# Patient Record
Sex: Female | Born: 1965
Health system: Southern US, Community
[De-identification: ages and names within clinical notes are randomized; demographics above are authoritative.]

## PROBLEM LIST (undated history)

## (undated) DIAGNOSIS — Z8541 Personal history of malignant neoplasm of cervix uteri: Secondary | ICD-10-CM

## (undated) DIAGNOSIS — T7840XA Allergy, unspecified, initial encounter: Secondary | ICD-10-CM

## (undated) DIAGNOSIS — R32 Unspecified urinary incontinence: Secondary | ICD-10-CM

## (undated) DIAGNOSIS — K589 Irritable bowel syndrome without diarrhea: Secondary | ICD-10-CM

## (undated) DIAGNOSIS — M7711 Lateral epicondylitis, right elbow: Secondary | ICD-10-CM

## (undated) DIAGNOSIS — K5900 Constipation, unspecified: Secondary | ICD-10-CM

## (undated) DIAGNOSIS — E785 Hyperlipidemia, unspecified: Secondary | ICD-10-CM

## (undated) DIAGNOSIS — B019 Varicella without complication: Secondary | ICD-10-CM

## (undated) DIAGNOSIS — I10 Essential (primary) hypertension: Secondary | ICD-10-CM

## (undated) HISTORY — DX: Personal history of malignant neoplasm of cervix uteri: Z85.41

## (undated) HISTORY — DX: Hyperlipidemia, unspecified: E78.5

## (undated) HISTORY — DX: Constipation, unspecified: K59.00

## (undated) HISTORY — DX: Essential (primary) hypertension: I10

## (undated) HISTORY — DX: Allergy, unspecified, initial encounter: T78.40XA

## (undated) HISTORY — DX: Unspecified urinary incontinence: R32

## (undated) HISTORY — DX: Varicella without complication: B01.9

## (undated) HISTORY — DX: Lateral epicondylitis, right elbow: M77.11

## (undated) HISTORY — DX: Irritable bowel syndrome without diarrhea: K58.9

---

## 1993-10-14 HISTORY — PX: RADICAL HYSTERECTOMY: SHX2283

## 2000-10-14 DIAGNOSIS — K589 Irritable bowel syndrome without diarrhea: Secondary | ICD-10-CM

## 2000-10-14 HISTORY — DX: Irritable bowel syndrome, unspecified: K58.9

## 2000-10-14 LAB — HM COLONOSCOPY

## 2006-10-14 HISTORY — PX: BREAST REDUCTION SURGERY: SHX8

## 2012-01-01 ENCOUNTER — Other Ambulatory Visit (HOSPITAL_COMMUNITY): Payer: Self-pay | Admitting: Obstetrics and Gynecology

## 2012-01-01 DIAGNOSIS — Z1231 Encounter for screening mammogram for malignant neoplasm of breast: Secondary | ICD-10-CM

## 2012-01-27 ENCOUNTER — Ambulatory Visit (HOSPITAL_COMMUNITY)
Admission: RE | Admit: 2012-01-27 | Discharge: 2012-01-27 | Disposition: A | Discharge status: Home / Self Care | Payer: BC Managed Care – PPO | Source: Ambulatory Visit | Attending: Obstetrics and Gynecology | Admitting: Obstetrics and Gynecology

## 2012-01-27 DIAGNOSIS — Z1231 Encounter for screening mammogram for malignant neoplasm of breast: Secondary | ICD-10-CM

## 2012-02-10 ENCOUNTER — Other Ambulatory Visit: Payer: Self-pay | Admitting: Obstetrics and Gynecology

## 2012-02-10 DIAGNOSIS — R928 Other abnormal and inconclusive findings on diagnostic imaging of breast: Secondary | ICD-10-CM

## 2012-02-14 ENCOUNTER — Ambulatory Visit
Admission: RE | Admit: 2012-02-14 | Discharge: 2012-02-14 | Disposition: A | Discharge status: Home / Self Care | Payer: BC Managed Care – PPO | Source: Ambulatory Visit | Attending: Obstetrics and Gynecology | Admitting: Obstetrics and Gynecology

## 2012-02-14 DIAGNOSIS — R928 Other abnormal and inconclusive findings on diagnostic imaging of breast: Secondary | ICD-10-CM

## 2012-10-20 ENCOUNTER — Other Ambulatory Visit (HOSPITAL_COMMUNITY): Payer: Self-pay | Admitting: Internal Medicine

## 2012-10-20 DIAGNOSIS — R079 Chest pain, unspecified: Secondary | ICD-10-CM

## 2012-10-28 ENCOUNTER — Ambulatory Visit (HOSPITAL_COMMUNITY)
Admission: RE | Admit: 2012-10-28 | Discharge: 2012-10-28 | Disposition: A | Discharge status: Home / Self Care | Payer: 59 | Source: Ambulatory Visit | Attending: Internal Medicine | Admitting: Internal Medicine

## 2012-10-28 DIAGNOSIS — R079 Chest pain, unspecified: Secondary | ICD-10-CM | POA: Insufficient documentation

## 2013-02-02 ENCOUNTER — Other Ambulatory Visit (HOSPITAL_COMMUNITY): Payer: Self-pay | Admitting: Obstetrics and Gynecology

## 2013-02-02 DIAGNOSIS — Z1231 Encounter for screening mammogram for malignant neoplasm of breast: Secondary | ICD-10-CM

## 2013-02-11 LAB — HM PAP SMEAR

## 2013-02-11 LAB — HM MAMMOGRAPHY

## 2013-02-15 ENCOUNTER — Ambulatory Visit (HOSPITAL_COMMUNITY)
Admission: RE | Admit: 2013-02-15 | Discharge: 2013-02-15 | Disposition: A | Discharge status: Home / Self Care | Payer: 59 | Source: Ambulatory Visit | Attending: Obstetrics and Gynecology | Admitting: Obstetrics and Gynecology

## 2013-02-15 DIAGNOSIS — Z1231 Encounter for screening mammogram for malignant neoplasm of breast: Secondary | ICD-10-CM

## 2013-09-24 ENCOUNTER — Encounter: Payer: Self-pay | Admitting: Physician Assistant

## 2013-09-24 ENCOUNTER — Ambulatory Visit (INDEPENDENT_AMBULATORY_CARE_PROVIDER_SITE_OTHER): Payer: 59 | Admitting: Physician Assistant

## 2013-09-24 VITALS — BP 146/88 | HR 67 | Temp 97.7°F | Ht 64.0 in | Wt 123.8 lb

## 2013-09-24 DIAGNOSIS — G4700 Insomnia, unspecified: Secondary | ICD-10-CM

## 2013-09-24 DIAGNOSIS — I10 Essential (primary) hypertension: Secondary | ICD-10-CM

## 2013-09-24 DIAGNOSIS — E785 Hyperlipidemia, unspecified: Secondary | ICD-10-CM

## 2013-09-24 MED ORDER — LISINOPRIL 10 MG PO TABS: 10.0000 mg | ORAL_TABLET | Freq: Every day | ORAL | Status: DC

## 2013-09-24 MED ORDER — TRAZODONE HCL 50 MG PO TABS: 25.0000 mg | ORAL_TABLET | Freq: Every evening | ORAL | Status: DC | PRN

## 2013-09-24 NOTE — Progress Notes (Signed)
Pre-visit discussion using our clinic review tool. No additional management support is needed unless otherwise documented below in the visit note.  

## 2013-09-24 NOTE — Patient Instructions (Signed)
Please take medication as prescribed.  Monitor BP.  Return to clinic in 2 weeks for a blood pressure recheck.

## 2013-09-26 DIAGNOSIS — E785 Hyperlipidemia, unspecified: Secondary | ICD-10-CM | POA: Insufficient documentation

## 2013-09-26 DIAGNOSIS — G4700 Insomnia, unspecified: Secondary | ICD-10-CM | POA: Insufficient documentation

## 2013-09-26 DIAGNOSIS — I10 Essential (primary) hypertension: Secondary | ICD-10-CM | POA: Insufficient documentation

## 2013-09-26 NOTE — Progress Notes (Signed)
Patient ID: Melody Wilcox, female   DOB: 1966-04-18, 47 y.o.   MRN: [id number]  Patient presents to clinic today for refill of BP medication.  Patient is new to our practice and will be formally establishing with Dr. Abner Greenspan.  Already has visit scheduled.   Patient endorses 1 year history of hypertension diagnosed after her BP was significantly elevated at a dentist's visit.  Patient was evaluated by her previous PCP and diagnosis of HTN made.  Patient placed on 5 mg Lisinopril daily.  Patient denies chest pain, shortness of breath, headache, vision changes, LH or syncope.  Endorses occasional palpitations.  Has had several EKGs, all showing no abnormalities.  Will need to obtain records from prior PCP.  BP elevated at 146/88 today in clinic.  States she does not check her BP at home.  Denies history of anxiety or depression.  Has never been placed on Holter monitor.    Patient also requests refill of her Desyrel that she takes at bedtime for sleep.  Endorses good sleep with medication.  Patient also endorses history of hyperlipidemia.  Patient states her physician instructed her to take a fish oil supplement.  Will need fasting lipid panel at visit with Dr. Abner Greenspan.  Patient declines labs at present time.   Past Medical History  Diagnosis Date  . Hyperlipidemia   . Hypertension     No current outpatient prescriptions on file prior to visit.   No current facility-administered medications on file prior to visit.    Allergies  Allergen Reactions  . Morphine And Related   . Penicillins   . Sulfa Antibiotics   . Tape     Family History  Problem Relation Age of Onset  . Cancer Mother 88    breast cancer  . Hypertension Father   . Heart disease Father   . Hypertension Paternal Uncle     History   Social History  . Marital Status: Married    Spouse Name: N/A    Number of Children: N/A  . Years of Education: N/A   Social History Main Topics  . Smoking status: Never Smoker   .  Smokeless tobacco: Never Used  . Alcohol Use: 4.2 oz/week    7 Glasses of wine per week     Comment: socially -- glass of wine a ngiht.  . Drug Use: No  . Sexual Activity: Yes    Birth Control/ Protection: Surgical   Other Topics Concern  . None   Social History Narrative  . None   Review of Systems - See HPI.  All Other ROS are negative.  Filed Vitals:   09/24/13 1121  BP: 146/88  Pulse: 67  Temp: 97.7 F (36.5 C)   Physical Exam  Vitals reviewed. Constitutional: She is oriented to person, place, and time and well-developed, well-nourished, and in no distress.  HENT:  Head: Normocephalic and atraumatic.  Right Ear: External ear normal.  Left Ear: External ear normal.  Nose: Nose normal.  Mouth/Throat: Oropharynx is clear and moist. No oropharyngeal exudate.  TM within normal limits bilaterally.  Eyes: Conjunctivae and EOM are normal. Pupils are equal, round, and reactive to light.  Neck: Neck supple.  Cardiovascular: Normal rate, regular rhythm, normal heart sounds and intact distal pulses.   Pulmonary/Chest: Effort normal and breath sounds normal. No respiratory distress. She has no wheezes. She has no rales. She exhibits no tenderness.  Neurological: She is alert and oriented to person, place, and time.  Skin: Skin is warm  and dry. No rash noted.  Psychiatric: Affect normal.   Assessment/Plan: No problem-specific assessment & plan notes found for this encounter.

## 2013-09-26 NOTE — Assessment & Plan Note (Signed)
Refill Trazodone.  Patient informed that when she establishes with Dr. Abner Greenspan, we will need to obtain lab work.

## 2013-09-26 NOTE — Assessment & Plan Note (Addendum)
BP elevated.  Reviewed patient's BP log, revealing similar elevated BP readings.  Increase Lisinopril to 10 mg daily.  DASH diet given.  Return in 2 weeks for BP recheck.  Patient endorses occasional palpitations.  Previous EKG from prior PCP reviewed showing NSR.  Will scan into record.  Patient declines holter monitor at present time.

## 2013-10-08 ENCOUNTER — Ambulatory Visit (INDEPENDENT_AMBULATORY_CARE_PROVIDER_SITE_OTHER): Payer: 59

## 2013-10-08 VITALS — BP 108/72 | HR 73

## 2013-10-08 DIAGNOSIS — I10 Essential (primary) hypertension: Secondary | ICD-10-CM

## 2013-10-08 NOTE — Progress Notes (Signed)
   Subjective:    Patient ID: Melody Wilcox, female    DOB: [birth year], 47 y.o.   MRN: [id number]  HPI    Review of Systems     Objective:   Physical Exam        Assessment & Plan:  Patient came in today for a BP check

## 2013-10-20 ENCOUNTER — Ambulatory Visit: Payer: 59 | Admitting: Physician Assistant

## 2013-12-13 ENCOUNTER — Other Ambulatory Visit: Payer: Self-pay | Admitting: Family Medicine

## 2013-12-13 ENCOUNTER — Encounter: Payer: Self-pay | Admitting: Family Medicine

## 2013-12-13 ENCOUNTER — Ambulatory Visit (HOSPITAL_BASED_OUTPATIENT_CLINIC_OR_DEPARTMENT_OTHER)
Admission: RE | Admit: 2013-12-13 | Discharge: 2013-12-13 | Disposition: A | Discharge status: Home / Self Care | Payer: 59 | Source: Ambulatory Visit | Attending: Family Medicine | Admitting: Family Medicine

## 2013-12-13 ENCOUNTER — Ambulatory Visit (INDEPENDENT_AMBULATORY_CARE_PROVIDER_SITE_OTHER): Payer: 59 | Admitting: Family Medicine

## 2013-12-13 VITALS — BP 102/68 | HR 67 | Temp 97.5°F | Ht 64.0 in | Wt 128.1 lb

## 2013-12-13 DIAGNOSIS — E785 Hyperlipidemia, unspecified: Secondary | ICD-10-CM

## 2013-12-13 DIAGNOSIS — N951 Menopausal and female climacteric states: Secondary | ICD-10-CM

## 2013-12-13 DIAGNOSIS — R1013 Epigastric pain: Secondary | ICD-10-CM

## 2013-12-13 DIAGNOSIS — I10 Essential (primary) hypertension: Secondary | ICD-10-CM

## 2013-12-13 DIAGNOSIS — M549 Dorsalgia, unspecified: Secondary | ICD-10-CM

## 2013-12-13 DIAGNOSIS — Z1211 Encounter for screening for malignant neoplasm of colon: Secondary | ICD-10-CM

## 2013-12-13 DIAGNOSIS — Z8541 Personal history of malignant neoplasm of cervix uteri: Secondary | ICD-10-CM

## 2013-12-13 DIAGNOSIS — G4700 Insomnia, unspecified: Secondary | ICD-10-CM

## 2013-12-13 DIAGNOSIS — R32 Unspecified urinary incontinence: Secondary | ICD-10-CM | POA: Insufficient documentation

## 2013-12-13 DIAGNOSIS — K589 Irritable bowel syndrome without diarrhea: Secondary | ICD-10-CM

## 2013-12-13 LAB — SEDIMENTATION RATE: Sed Rate: 1 mm/hr (ref 0–22)

## 2013-12-13 MED ORDER — ESCITALOPRAM OXALATE 10 MG PO TABS: 10.0000 mg | ORAL_TABLET | Freq: Every day | ORAL | Status: DC

## 2013-12-13 MED ORDER — LOSARTAN POTASSIUM 25 MG PO TABS: 25.0000 mg | ORAL_TABLET | Freq: Every day | ORAL | Status: DC

## 2013-12-13 MED ORDER — CYCLOBENZAPRINE HCL 10 MG PO TABS: ORAL_TABLET | ORAL | Status: DC

## 2013-12-13 NOTE — Progress Notes (Signed)
Patient ID: Melody Wilcox, female   DOB: 1966/10/01, 48 y.o.   MRN: [id number] Melody Wilcox [id number] [birth year] 12/13/2013      Progress Note-Follow Up  Subjective  Chief Complaint  Chief Complaint  Patient presents with  . Establish Care    new patient    HPI  Patient is a 48 year old Caucasian female who is in today for new patient appointment. She is generally in good health but does have a few concerns. She has perimenopausal symptoms but night sweats several times a week. They are not overwhelming. She underwent hysterectomy at age 28. She struggles with intermittent constipation and diarrhea as well but no bloody or tarry stool he is struggling with increased epigastric pain recently. Has a dry cough present for roughly 9 months. Noting some intermittent low back pain and right knee pain as well but denies any recent injury. Has an appointment with [facility] later in the spring for her annual exam  Past Medical History  Diagnosis Date  . Hyperlipidemia   . Hypertension   . Chicken pox middle school  . IBS (irritable bowel syndrome) 2002    Past Surgical History  Procedure Laterality Date  . Radical hysterectomy    . Breast reduction surgery  2008  . Abdominal hysterectomy      Family History  Problem Relation Age of Onset  . Cancer Mother 35    breast cancer  . Hypertension Father   . Heart disease Father   . Hypertension Paternal Uncle   . Gout Brother   . Cancer Maternal Grandmother 5    breast  . Diabetes Maternal Grandfather     type 2  . Heart disease Paternal Grandmother   . Other Paternal Grandfather     brain tumor    History   Social History  . Marital Status: Married    Spouse Name: N/A    Number of Children: N/A  . Years of Education: N/A   Occupational History  . Not on file.   Social History Main Topics  . Smoking status: Never Smoker   . Smokeless tobacco: Never Used  . Alcohol Use: 4.2 oz/week    7 Glasses of wine per  week     Comment: socially -- glass of wine a ngiht.  . Drug Use: No  . Sexual Activity: Yes    Birth Control/ Protection: Surgical   Other Topics Concern  . Not on file   Social History Narrative  . No narrative on file    Current Outpatient Prescriptions on File Prior to Visit  Medication Sig Dispense Refill  . lisinopril (PRINIVIL,ZESTRIL) 10 MG tablet Take 1 tablet (10 mg total) by mouth daily.  90 tablet  3  . traZODone (DESYREL) 50 MG tablet Take 0.5 tablets (25 mg total) by mouth at bedtime as needed for sleep.  30 tablet  2   No current facility-administered medications on file prior to visit.    Allergies  Allergen Reactions  . Morphine And Related   . Penicillins   . Sulfa Antibiotics   . Tape     Review of Systems  Review of Systems  Constitutional: Negative for fever, chills and malaise/fatigue.  HENT: Negative for congestion, hearing loss and nosebleeds.   Eyes: Negative for discharge.  Respiratory: Negative for cough, sputum production, shortness of breath and wheezing.   Cardiovascular: Negative for chest pain, palpitations and leg swelling.  Gastrointestinal: Negative for heartburn, nausea, vomiting, abdominal pain, diarrhea, constipation and blood in  stool.  Genitourinary: Negative for dysuria, urgency, frequency and hematuria.  Musculoskeletal: Negative for back pain, falls and myalgias.  Skin: Negative for rash.  Neurological: Negative for dizziness, tremors, sensory change, focal weakness, loss of consciousness, weakness and headaches.  Endo/Heme/Allergies: Negative for polydipsia. Does not bruise/bleed easily.  Psychiatric/Behavioral: Negative for depression and suicidal ideas. The patient is not nervous/anxious and does not have insomnia.     Objective  BP 102/68  Pulse 67  Temp(Src) 97.5 F (36.4 C) (Oral)  Ht 5\' 4"  (1.626 m)  Wt 128 lb 1.3 oz (58.097 kg)  BMI 21.97 kg/m2  SpO2 97%  Physical Exam  Physical Exam  Constitutional: She is  oriented to person, place, and time and well-developed, well-nourished, and in no distress. No distress.  HENT:  Head: Normocephalic and atraumatic.  Right Ear: External ear normal.  Left Ear: External ear normal.  Nose: Nose normal.  Mouth/Throat: Oropharynx is clear and moist. No oropharyngeal exudate.  Eyes: Conjunctivae are normal. Pupils are equal, round, and reactive to light. Right eye exhibits no discharge. Left eye exhibits no discharge. No scleral icterus.  Neck: Normal range of motion. Neck supple. No thyromegaly present.  Cardiovascular: Normal rate, regular rhythm, normal heart sounds and intact distal pulses.   No murmur heard. Pulmonary/Chest: Effort normal and breath sounds normal. No respiratory distress. She has no wheezes. She has no rales.  Abdominal: Soft. Bowel sounds are normal. She exhibits no distension and no mass. There is no tenderness.  Musculoskeletal: Normal range of motion. She exhibits no edema and no tenderness.  Lymphadenopathy:    She has no cervical adenopathy.  Neurological: She is alert and oriented to person, place, and time. She has normal reflexes. No cranial nerve deficit. Coordination normal.  Skin: Skin is warm and dry. No rash noted. She is not diaphoretic.  Psychiatric: Mood, memory and affect normal.      Assessment & Plan  Hyperlipidemia Patient reports mild eleavation in past. Avoid trans fats and monitor, encouraged increase exercise  IBS (irritable bowel syndrome) Encouraged daily probiotics, fiber supplements and increase fiber in diet and fluids.   HTN (hypertension) Well controlled, no changes.  Back pain Encouraged topical treatments such as Salon Pas, gentle stretching, continue Pilates and consider PT and/or chiropractic if persists. Given Flexeril to use qhs prn  Abdominal pain, epigastric H Pylori negative. If persists will need an abdominal ultrasound  Insomnia Using Trazodone prn. Encouraged adequate sleep  hygiene

## 2013-12-13 NOTE — Patient Instructions (Signed)
Salon Pas or Icy Hot patches for back pain  Cholecystitis Cholecystitis is an inflammation of your gallbladder. It is usually caused by a buildup of gallstones or sludge (cholelithiasis) in your gallbladder. The gallbladder stores a fluid that helps digest fats (bile). Cholecystitis is serious and needs treatment right away.  CAUSES   Gallstones. Gallstones can block the tube that leads to your gallbladder, causing bile to build up. As bile builds up, the gallbladder becomes inflamed.  Bile duct problems, such as blockage from scarring or kinking.  Tumors. Tumors can stop bile from leaving your gallbladder correctly, causing bile to build up. As bile builds up, the gallbladder becomes inflamed. SYMPTOMS   Nausea.  Vomiting.  Abdominal pain, especially in the upper right area of your abdomen.  Abdominal tenderness or bloating.  Sweating.  Chills.  Fever.  Yellowing of the skin and the whites of the eyes (jaundice). DIAGNOSIS  Your caregiver may order blood tests to look for infection or gallbladder problems. Your caregiver may also order imaging tests, such as an ultrasound or computed tomography (CT) scan. Further tests may include a hepatobiliary iminodiacetic acid (HIDA) scan. This scan allows your caregiver to see your bile move from the liver to the gallbladder and to the small intestine. TREATMENT  A hospital stay is usually necessary to lessen the inflammation of your gallbladder. You may be required to not eat or drink (fast) for a certain amount of time. You may be given medicine to treat pain or an antibiotic medicine to treat an infection. Surgery may be needed to remove your gallbladder (cholecystectomy) once the inflammation has gone down. Surgery may be needed right away if you develop complications such as death of gallbladder tissue (gangrene) or a tear (perforation) of the gallbladder.  [place] care will depend on your treatment. In general:  If  you were given antibiotics, take them as directed. Finish them even if you start to feel better.  Only take over-the-counter or prescription medicines for pain, discomfort, or fever as directed by your caregiver.  Follow a low-fat diet until you see your caregiver again.  Keep all follow-up visits as directed by your caregiver. SEEK IMMEDIATE MEDICAL CARE IF:   Your pain is increasing and not controlled by medicines.  Your pain moves to another part of your abdomen or to your back.  You have a fever.  You have nausea and vomiting. MAKE SURE YOU:  Understand these instructions.  Will watch your condition.  Will get help right away if you are not doing well or get worse. Document Released: 09/30/2005 Document Revised: 12/23/2011 Document Reviewed: 08/16/2011 [facility] Patient Information 2014 [place], [place].

## 2013-12-13 NOTE — Progress Notes (Signed)
Pre visit review using our clinic review tool, if applicable. No additional management support is needed unless otherwise documented below in the visit note. 

## 2013-12-14 ENCOUNTER — Telehealth: Payer: Self-pay | Admitting: Family Medicine

## 2013-12-14 LAB — HEPATIC FUNCTION PANEL
ALT: 14 U/L (ref 0–35)
AST: 17 U/L (ref 0–37)
Albumin: 4.1 g/dL (ref 3.5–5.2)
Alkaline Phosphatase: 43 U/L (ref 39–117)
BILIRUBIN DIRECT: 0.1 mg/dL (ref 0.0–0.3)
BILIRUBIN TOTAL: 0.3 mg/dL (ref 0.2–1.2)
Indirect Bilirubin: 0.2 mg/dL (ref 0.2–1.2)
Total Protein: 6.1 g/dL (ref 6.0–8.3)

## 2013-12-14 LAB — H. PYLORI ANTIBODY, IGG: H Pylori IgG: <0.4 {ISR}

## 2013-12-14 NOTE — Telephone Encounter (Signed)
Relevant patient education assigned to patient using Emmi. ° °

## 2013-12-15 ENCOUNTER — Encounter: Payer: Self-pay | Admitting: Family Medicine

## 2013-12-15 DIAGNOSIS — N951 Menopausal and female climacteric states: Secondary | ICD-10-CM | POA: Insufficient documentation

## 2013-12-15 DIAGNOSIS — Z8541 Personal history of malignant neoplasm of cervix uteri: Secondary | ICD-10-CM | POA: Insufficient documentation

## 2013-12-15 DIAGNOSIS — M549 Dorsalgia, unspecified: Secondary | ICD-10-CM | POA: Insufficient documentation

## 2013-12-15 DIAGNOSIS — R1013 Epigastric pain: Secondary | ICD-10-CM | POA: Insufficient documentation

## 2013-12-15 DIAGNOSIS — I10 Essential (primary) hypertension: Secondary | ICD-10-CM | POA: Insufficient documentation

## 2013-12-15 HISTORY — DX: Personal history of malignant neoplasm of cervix uteri: Z85.41

## 2013-12-15 NOTE — Assessment & Plan Note (Deleted)
Well controlled, no changes 

## 2013-12-15 NOTE — Assessment & Plan Note (Signed)
Patient reports mild eleavation in past. Avoid trans fats and monitor, encouraged increase exercise

## 2013-12-15 NOTE — Assessment & Plan Note (Signed)
H Pylori negative. If persists will need an abdominal ultrasound

## 2013-12-15 NOTE — Assessment & Plan Note (Signed)
Encouraged daily probiotics, fiber supplements and increase fiber in diet and fluids.

## 2013-12-15 NOTE — Assessment & Plan Note (Signed)
Well controlled, no changes 

## 2013-12-15 NOTE — Assessment & Plan Note (Addendum)
Encouraged topical treatments such as Salon Pas, gentle stretching, continue Pilates and consider PT and/or chiropractic if persists. Given Flexeril to use qhs prn

## 2013-12-15 NOTE — Assessment & Plan Note (Signed)
Using Trazodone prn. Encouraged adequate sleep hygiene

## 2014-01-19 ENCOUNTER — Encounter: Payer: Self-pay | Admitting: Family Medicine

## 2014-02-04 ENCOUNTER — Ambulatory Visit (INDEPENDENT_AMBULATORY_CARE_PROVIDER_SITE_OTHER): Payer: 59 | Admitting: Physician Assistant

## 2014-02-04 ENCOUNTER — Encounter: Payer: Self-pay | Admitting: Physician Assistant

## 2014-02-04 VITALS — BP 120/82 | HR 69 | Temp 97.8°F | Resp 16 | Ht 64.0 in | Wt 129.0 lb

## 2014-02-04 DIAGNOSIS — J209 Acute bronchitis, unspecified: Secondary | ICD-10-CM

## 2014-02-04 MED ORDER — AZITHROMYCIN 250 MG PO TABS: ORAL_TABLET | ORAL | Status: DC

## 2014-02-04 MED ORDER — HYDROCOD POLST-CHLORPHEN POLST 10-8 MG/5ML PO LQCR: 5.0000 mL | Freq: Two times a day (BID) | ORAL | Status: DC | PRN

## 2014-02-04 NOTE — Progress Notes (Signed)
Patient presents to clinic today c/o 4-5 weeks of upper respiratory symptoms that started out as sinus pressure, sinus pain and ear fullness that has moved into her chest.  Endorses 3-4 weeks of cough that is sometimes productive. Denies fever, chills, aches, recent travel or sick contact.  Past Medical History  Diagnosis Date  . Hyperlipidemia   . Hypertension   . Chicken pox middle school  . IBS (irritable bowel syndrome) 2002  . Enuresis     childhood, resolved  . Allergy     PCN, sulfa, Lidocaine cream and morphine  . History of cervical cancer 12/15/2013    Current Outpatient Prescriptions on File Prior to Visit  Medication Sig Dispense Refill  . cyclobenzaprine (FLEXERIL) 10 MG tablet 1 tab po qhs prn pain, muscle spasam  30 tablet  1  . losartan (COZAAR) 25 MG tablet Take 1 tablet (25 mg total) by mouth daily. HTN  30 tablet  5  . traZODone (DESYREL) 50 MG tablet Take 0.5 tablets (25 mg total) by mouth at bedtime as needed for sleep.  30 tablet  2   No current facility-administered medications on file prior to visit.    Allergies  Allergen Reactions  . Morphine And Related   . Penicillins   . Sulfa Antibiotics   . Tape     Family History  Problem Relation Age of Onset  . Cancer Mother 47    breast cancer  . Fibromyalgia Mother   . Hypertension Father   . Heart disease Father   . Hypertension Paternal Uncle   . Gout Brother   . Cancer Maternal Grandmother 3    breast  . Diabetes Maternal Grandfather     type 2  . Heart disease Paternal Grandmother   . Other Paternal Grandfather     brain tumor    History   Social History  . Marital Status: Married    Spouse Name: N/A    Number of Children: N/A  . Years of Education: N/A   Social History Main Topics  . Smoking status: Never Smoker   . Smokeless tobacco: Never Used  . Alcohol Use: 4.2 oz/week    7 Glasses of wine per week     Comment: socially -- glass of wine a ngiht.  . Drug Use: No  . Sexual  Activity: Yes    Birth Control/ Protection: Surgical     Comment: live with   Other Topics Concern  . None   Social History Narrative  . None   Review of Systems - See HPI.  All other ROS are negative.  BP 120/82  Pulse 69  Temp(Src) 97.8 F (36.6 C) (Oral)  Resp 16  Ht 5\' 4"  (1.626 m)  Wt 129 lb (58.514 kg)  BMI 22.13 kg/m2  SpO2 98%  Physical Exam  Vitals reviewed. Constitutional: She is oriented to person, place, and time and well-developed, well-nourished, and in no distress.  HENT:  Head: Normocephalic and atraumatic.  Right Ear: External ear normal.  Left Ear: External ear normal.  Nose: Nose normal.  Mouth/Throat: Oropharynx is clear and moist. No oropharyngeal exudate.  TM within normal limits bilaterally.  No TTP of sinuses noted.  Eyes: Conjunctivae are normal. Pupils are equal, round, and reactive to light.  Neck: Neck supple.  Cardiovascular: Normal rate, regular rhythm, normal heart sounds and intact distal pulses.   No murmur heard. Pulmonary/Chest: Effort normal and breath sounds normal. No respiratory distress. She has no wheezes. She has no  rales. She exhibits no tenderness.  Lymphadenopathy:    She has no cervical adenopathy.  Neurological: She is alert and oriented to person, place, and time.  Skin: Skin is warm and dry. No rash noted.  Psychiatric: Affect normal.   Recent Results (from the past 2160 hour(s))  H. PYLORI ANTIBODY, IGG     Status: None   Collection Time    12/13/13  4:21 PM      Result Value Ref Range   H Pylori IgG <0.40     Comment: No significant level of IgG antibody to H. pylori detected.              ISR = Immune Status Ratio                  <0.90         ISR       Negative                  0.90 - 1.09   ISR       Equivocal                  >=1.10        ISR       Positive           The above results were obtained with the Immulite 2000 H. pylori IgG     EIA.  Results obtained from other manufacturer's assay methods may  not     be used interchangeably.        SEDIMENTATION RATE     Status: None   Collection Time    12/13/13  4:21 PM      Result Value Ref Range   Sed Rate 1  0 - 22 mm/hr  HEPATIC FUNCTION PANEL     Status: None   Collection Time    12/13/13  4:21 PM      Result Value Ref Range   Total Bilirubin 0.3  0.2 - 1.2 mg/dL   Bilirubin, Direct 0.1  0.0 - 0.3 mg/dL   Indirect Bilirubin 0.2  0.2 - 1.2 mg/dL   Alkaline Phosphatase 43  39 - 117 U/L   AST 17  0 - 37 U/L   ALT 14  0 - 35 U/L   Total Protein 6.1  6.0 - 8.3 g/dL   Albumin 4.1  3.5 - 5.2 g/dL   Assessment/Plan: Acute bronchitis Rx Azithromycin.  Increase fluid intake.  Rx Tussionex for cough.  Continue Flonase.  Plain Mucinex.  Humidifier in bedroom.

## 2014-02-04 NOTE — Assessment & Plan Note (Signed)
Rx Azithromycin.  Increase fluid intake.  Rx Tussionex for cough.  Continue Flonase.  Plain Mucinex.  Humidifier in bedroom.

## 2014-02-04 NOTE — Progress Notes (Signed)
Pre visit review using our clinic review tool, if applicable. No additional management support is needed unless otherwise documented below in the visit note/SLS  

## 2014-02-04 NOTE — Patient Instructions (Signed)
Please take antibiotic as directed.  Increase fluid intake.  Rest. Use Flonase daily.  Use prescription cough syrup as directed.  Place a humidifier in the bedroom.  Call or return to clinic if symptoms are not improving.

## 2014-02-10 ENCOUNTER — Ambulatory Visit (INDEPENDENT_AMBULATORY_CARE_PROVIDER_SITE_OTHER): Payer: 59 | Admitting: Family Medicine

## 2014-02-10 ENCOUNTER — Encounter: Payer: Self-pay | Admitting: Family Medicine

## 2014-02-10 VITALS — BP 116/84 | HR 93 | Temp 98.3°F | Ht 64.0 in | Wt 129.0 lb

## 2014-02-10 DIAGNOSIS — I10 Essential (primary) hypertension: Secondary | ICD-10-CM

## 2014-02-10 DIAGNOSIS — J209 Acute bronchitis, unspecified: Secondary | ICD-10-CM

## 2014-02-10 MED ORDER — ALBUTEROL SULFATE HFA 108 (90 BASE) MCG/ACT IN AERS: 2.0000 | INHALATION_SPRAY | Freq: Four times a day (QID) | RESPIRATORY_TRACT | Status: DC | PRN

## 2014-02-10 MED ORDER — PREDNISONE 20 MG PO TABS: 20.0000 mg | ORAL_TABLET | Freq: Two times a day (BID) | ORAL | Status: DC

## 2014-02-10 MED ORDER — HYDROCODONE-HOMATROPINE 5-1.5 MG/5ML PO SYRP: 5.0000 mL | ORAL_SOLUTION | Freq: Three times a day (TID) | ORAL | Status: DC | PRN

## 2014-02-10 MED ORDER — DOXYCYCLINE HYCLATE 100 MG PO TABS: 100.0000 mg | ORAL_TABLET | Freq: Two times a day (BID) | ORAL | Status: DC

## 2014-02-10 NOTE — Patient Instructions (Addendum)
Probiotic daily Digestive Advanatage  Needs number for gastroenterology  Bronchitis Bronchitis is inflammation of the airways that extend from the windpipe into the lungs (bronchi). The inflammation often causes mucus to develop, which leads to a cough. If the inflammation becomes severe, it may cause shortness of breath. CAUSES  Bronchitis may be caused by:   Viral infections.   Bacteria.   Cigarette smoke.   Allergens, pollutants, and other irritants.  SIGNS AND SYMPTOMS  The most common symptom of bronchitis is a frequent cough that produces mucus. Other symptoms include:  Fever.   Body aches.   Chest congestion.   Chills.   Shortness of breath.   Sore throat.  DIAGNOSIS  Bronchitis is usually diagnosed through a medical history and physical exam. Tests, such as chest X-rays, are sometimes done to rule out other conditions.  TREATMENT  You may need to avoid contact with whatever caused the problem (smoking, for example). Medicines are sometimes needed. These may include:  Antibiotics. These may be prescribed if the condition is caused by bacteria.  Cough suppressants. These may be prescribed for relief of cough symptoms.   Inhaled medicines. These may be prescribed to help open your airways and make it easier for you to breathe.   Steroid medicines. These may be prescribed for those with recurrent (chronic) bronchitis. HOME CARE INSTRUCTIONS  Get plenty of rest.   Drink enough fluids to keep your urine clear or pale yellow (unless you have a medical condition that requires fluid restriction). Increasing fluids may help thin your secretions and will prevent dehydration.   Only take over-the-counter or prescription medicines as directed by your health care provider.  Only take antibiotics as directed. Make sure you finish them even if you start to feel better.  Avoid secondhand smoke, irritating chemicals, and strong fumes. These will make bronchitis  worse. If you are a smoker, quit smoking. Consider using nicotine gum or skin patches to help control withdrawal symptoms. Quitting smoking will help your lungs heal faster.   Put a cool-mist humidifier in your bedroom at night to moisten the air. This may help loosen mucus. Change the water in the humidifier daily. You can also run the hot water in your shower and sit in the bathroom with the door closed for 5 10 minutes.   Follow up with your health care provider as directed.   Wash your hands frequently to avoid catching bronchitis again or spreading an infection to others.  SEEK MEDICAL CARE IF: Your symptoms do not improve after 1 week of treatment.  SEEK IMMEDIATE MEDICAL CARE IF:  Your fever increases.  You have chills.   You have chest pain.   You have worsening shortness of breath.   You have bloody sputum.  You faint.  You have lightheadedness.  You have a severe headache.   You vomit repeatedly. MAKE SURE YOU:   Understand these instructions.  Will watch your condition.  Will get help right away if you are not doing well or get worse. Document Released: 09/30/2005 Document Revised: 07/21/2013 Document Reviewed: 05/25/2013 [facility] Patient Information 2014 [place].

## 2014-02-10 NOTE — Progress Notes (Signed)
Pre visit review using our clinic review tool, if applicable. No additional management support is needed unless otherwise documented below in the visit note. 

## 2014-02-13 ENCOUNTER — Encounter: Payer: Self-pay | Admitting: Family Medicine

## 2014-02-13 NOTE — Progress Notes (Signed)
Patient ID: Melody Wilcox, female   DOB: Mar 19, 1966, 48 y.o.   MRN: [id number] Melody Wilcox [id number] 01-Mar-1966 02/13/2014      Progress Note-Follow Up  Subjective  Chief Complaint  Chief Complaint  Patient presents with  . Bronchitis    not any better, finished zpak yesterday    HPI  Patient is a 48 year old female in today for routine medical care.  Patient was recently treated for bronchitis is just fnished her Z-Pak and continues to cough andi 2 sputum production, fatigue and malaise. No fevers chills but some congestion. No ear pain or sore throat. Denies CP/palp/SOB/HA/congestion/fevers/GI or GU c/o. Taking meds as prescribed  Past Medical History  Diagnosis Date  . Hyperlipidemia   . Hypertension   . Chicken pox middle school  . IBS (irritable bowel syndrome) 2002  . Enuresis     childhood, resolved  . Allergy     PCN, sulfa, Lidocaine cream and morphine  . History of cervical cancer 12/15/2013    Past Surgical History  Procedure Laterality Date  . Radical hysterectomy    . Breast reduction surgery  2008  . Abdominal hysterectomy      Family History  Problem Relation Age of Onset  . Cancer Mother 48    breast cancer  . Fibromyalgia Mother   . Hypertension Father   . Heart disease Father   . Hypertension Paternal Uncle   . Gout Brother   . Cancer Maternal Grandmother 28    breast  . Diabetes Maternal Grandfather     type 2  . Heart disease Paternal Grandmother   . Other Paternal Grandfather     brain tumor    History   Social History  . Marital Status: Married    Spouse Name: N/A    Number of Children: N/A  . Years of Education: N/A   Occupational History  . Not on file.   Social History Main Topics  . Smoking status: Never Smoker   . Smokeless tobacco: Never Used  . Alcohol Use: 4.2 oz/week    7 Glasses of wine per week     Comment: socially -- glass of wine a ngiht.  . Drug Use: No  . Sexual Activity: Yes    Birth Control/ Protection:  Surgical     Comment: live with   Other Topics Concern  . Not on file   Social History Narrative  . No narrative on file    Current Outpatient Prescriptions on File Prior to Visit  Medication Sig Dispense Refill  . fluticasone (FLONASE) 50 MCG/ACT nasal spray Place 2 sprays into both nostrils daily as needed for allergies or rhinitis.      Marland Kitchen losartan (COZAAR) 25 MG tablet Take 1 tablet (25 mg total) by mouth daily. HTN  30 tablet  5  . traZODone (DESYREL) 50 MG tablet Take 0.5 tablets (25 mg total) by mouth at bedtime as needed for sleep.  30 tablet  2   No current facility-administered medications on file prior to visit.    Allergies  Allergen Reactions  . Morphine And Related   . Penicillins   . Sulfa Antibiotics   . Tape     Review of Systems  Review of Systems  Constitutional: Positive for malaise/fatigue. Negative for fever.  HENT: Positive for congestion.   Eyes: Negative for discharge.  Respiratory: Positive for cough and sputum production. Negative for shortness of breath.   Cardiovascular: Negative for chest pain, palpitations and leg swelling.  Gastrointestinal: Negative for nausea, abdominal pain and diarrhea.  Genitourinary: Negative for dysuria.  Musculoskeletal: Positive for myalgias. Negative for falls.  Skin: Negative for rash.  Neurological: Negative for loss of consciousness and headaches.  Endo/Heme/Allergies: Negative for polydipsia.  Psychiatric/Behavioral: Negative for depression and suicidal ideas. The patient is not nervous/anxious and does not have insomnia.     Objective  BP 116/84  Pulse 93  Temp(Src) 98.3 F (36.8 C) (Oral)  Ht 5\' 4"  (1.626 m)  Wt 129 lb (58.514 kg)  BMI 22.13 kg/m2  SpO2 98%  Physical Exam  Physical Exam  Constitutional: She is oriented to person, place, and time and well-developed, well-nourished, and in no distress. No distress.  HENT:  Head: Normocephalic and atraumatic.  Eyes: Conjunctivae are normal.   Neck: Neck supple. No thyromegaly present.  Cardiovascular: Normal rate, regular rhythm and normal heart sounds.   No murmur heard. Pulmonary/Chest: Effort normal. No respiratory distress. She has no wheezes. She has no rales.  Abdominal: She exhibits no distension and no mass.  Musculoskeletal: She exhibits no edema.  Lymphadenopathy:    She has no cervical adenopathy.  Neurological: She is alert and oriented to person, place, and time.  Skin: Skin is warm and dry. No rash noted. She is not diaphoretic.  Psychiatric: Memory, affect and judgment normal.      Lab Results  Component Value Date   ALT 14 12/13/2013   AST 17 12/13/2013   ALKPHOS 43 12/13/2013   BILITOT 0.3 12/13/2013     Assessment & Plan  HTN (hypertension) Well controlled, no changes to meds. Encouraged heart healthy diet such as the DASH diet and exercise as tolerated.   Acute bronchitis Started pm steroids and antibiotics, Encouraged increased rest and hydration, add probiotics, zinc such as Coldeze or Xicam. Treat fevers as needed

## 2014-02-13 NOTE — Assessment & Plan Note (Signed)
Well controlled, no changes to meds. Encouraged heart healthy diet such as the DASH diet and exercise as tolerated.  °

## 2014-02-13 NOTE — Assessment & Plan Note (Signed)
Started pm steroids and antibiotics, Encouraged increased rest and hydration, add probiotics, zinc such as Coldeze or Xicam. Treat fevers as needed

## 2014-03-03 ENCOUNTER — Other Ambulatory Visit: Payer: Self-pay | Admitting: Obstetrics and Gynecology

## 2014-03-03 DIAGNOSIS — N644 Mastodynia: Secondary | ICD-10-CM

## 2014-03-04 ENCOUNTER — Other Ambulatory Visit: Payer: Self-pay | Admitting: Obstetrics and Gynecology

## 2014-03-04 DIAGNOSIS — Z803 Family history of malignant neoplasm of breast: Secondary | ICD-10-CM

## 2014-03-15 ENCOUNTER — Other Ambulatory Visit: Payer: 59

## 2014-03-15 ENCOUNTER — Ambulatory Visit: Payer: 59 | Admitting: Family Medicine

## 2014-03-16 ENCOUNTER — Other Ambulatory Visit: Payer: Self-pay | Admitting: Obstetrics and Gynecology

## 2014-03-16 ENCOUNTER — Ambulatory Visit
Admission: RE | Admit: 2014-03-16 | Discharge: 2014-03-16 | Disposition: A | Discharge status: Home / Self Care | Payer: 59 | Source: Ambulatory Visit | Attending: Obstetrics and Gynecology | Admitting: Obstetrics and Gynecology

## 2014-03-16 DIAGNOSIS — N644 Mastodynia: Secondary | ICD-10-CM

## 2014-04-18 ENCOUNTER — Other Ambulatory Visit: Payer: Self-pay | Admitting: Physician Assistant

## 2014-04-18 NOTE — Telephone Encounter (Signed)
rx printed for md to sign and fax 

## 2014-05-08 ENCOUNTER — Other Ambulatory Visit: Payer: Self-pay | Admitting: Family Medicine

## 2014-05-09 NOTE — Telephone Encounter (Signed)
Please advise refill? Last RX and appt on 02-10-14 quantity 30 with 1 refill

## 2014-05-13 ENCOUNTER — Ambulatory Visit (INDEPENDENT_AMBULATORY_CARE_PROVIDER_SITE_OTHER): Payer: 59 | Admitting: Physician Assistant

## 2014-05-13 ENCOUNTER — Encounter: Payer: Self-pay | Admitting: Physician Assistant

## 2014-05-13 VITALS — BP 128/82 | HR 75 | Temp 97.9°F | Resp 16 | Ht 64.0 in | Wt 132.2 lb

## 2014-05-13 DIAGNOSIS — R05 Cough: Secondary | ICD-10-CM | POA: Insufficient documentation

## 2014-05-13 DIAGNOSIS — F909 Attention-deficit hyperactivity disorder, unspecified type: Secondary | ICD-10-CM | POA: Insufficient documentation

## 2014-05-13 DIAGNOSIS — R059 Cough, unspecified: Secondary | ICD-10-CM | POA: Insufficient documentation

## 2014-05-13 MED ORDER — LISDEXAMFETAMINE DIMESYLATE 40 MG PO CAPS
40.0000 mg | ORAL_CAPSULE | ORAL | Status: DC
Start: 2014-05-13 — End: 2014-08-26

## 2014-05-13 MED ORDER — OMEPRAZOLE 40 MG PO CPDR: 40.0000 mg | DELAYED_RELEASE_CAPSULE | Freq: Every day | ORAL | Status: DC

## 2014-05-13 NOTE — Assessment & Plan Note (Signed)
Will restart Vyvanse 40 mg daily.  ADRs discussed with patient.  Follow-up with PCP in 1 month.

## 2014-05-13 NOTE — Assessment & Plan Note (Signed)
Possible silent reflux.  Will attempt 2 week trial of 40 mg Prilosec. If symptoms not improving, will need imaging and specialty referral.

## 2014-05-13 NOTE — Progress Notes (Signed)
Pre visit review using our clinic review tool, if applicable. No additional management support is needed unless otherwise documented below in the visit note/SLS  

## 2014-05-13 NOTE — Progress Notes (Signed)
Patient presents to clinic today c/o persistent dry cough over the past several months.  Symptoms initially thought to be due to ACEI therapy. ACEI was discontinued and symptoms have remained.  Patient denies SOB or wheezing.  Denies PND.  Denies acid reflux but endorses globus sensation.  Patient is a never smoker.  Patient also wishes to discuss restarting medications for her adult ADD.  Patient endorses previously being treated with Vyvanse with good resolution of symptoms.  Endorses inattentiveness and hyperactivity.  Has trouble finishing tasks.  Frequently interrupts others.  Has difficulty remaining still.  Past Medical History  Diagnosis Date  . Hyperlipidemia   . Hypertension   . Chicken pox middle school  . IBS (irritable bowel syndrome) 2002  . Enuresis     childhood, resolved  . Allergy     PCN, sulfa, Lidocaine cream and morphine  . History of cervical cancer 12/15/2013    Current Outpatient Prescriptions on File Prior to Visit  Medication Sig Dispense Refill  . cyclobenzaprine (FLEXERIL) 10 MG tablet TAKE 1 TABLET BY MOUTH EVERY NIGHT AT BEDTIME AS NEEDED FOR PAIN AND FOR MUSCLE SPASM  30 tablet  0  . fluticasone (FLONASE) 50 MCG/ACT nasal spray Place 2 sprays into both nostrils daily as needed for allergies or rhinitis.      Marland Kitchen losartan (COZAAR) 25 MG tablet Take 1 tablet (25 mg total) by mouth daily. HTN  30 tablet  5  . traZODone (DESYREL) 50 MG tablet TAKE 1/2 TABLETS (25 MG TOTAL) BY MOUTH AT BEDTIME AS NEEDED FOR SLEEP.  30 tablet  1   No current facility-administered medications on file prior to visit.    Allergies  Allergen Reactions  . Morphine And Related   . Penicillins   . Sulfa Antibiotics   . Tape     Family History  Problem Relation Age of Onset  . Cancer Mother 68    breast cancer  . Fibromyalgia Mother   . Hypertension Father   . Heart disease Father   . Hypertension Paternal Uncle   . Gout Brother   . Cancer Maternal Grandmother 58    breast   . Diabetes Maternal Grandfather     type 2  . Heart disease Paternal Grandmother   . Other Paternal Grandfather     brain tumor    History   Social History  . Marital Status: Married    Spouse Name: N/A    Number of Children: N/A  . Years of Education: N/A   Social History Main Topics  . Smoking status: Never Smoker   . Smokeless tobacco: Never Used  . Alcohol Use: 4.2 oz/week    7 Glasses of wine per week     Comment: socially -- glass of wine a ngiht.  . Drug Use: No  . Sexual Activity: Yes    Birth Control/ Protection: Surgical     Comment: live with   Other Topics Concern  . Not on file   Social History Narrative  . No narrative on file   Review of Systems - See HPI.  All other ROS are negative.  BP 128/82  Pulse 75  Temp(Src) 97.9 F (36.6 C) (Oral)  Resp 16  Ht 5\' 4"  (1.626 m)  Wt 132 lb 4 oz (59.988 kg)  BMI 22.69 kg/m2  SpO2 98%  Physical Exam  Vitals reviewed. Constitutional: She is well-developed, well-nourished, and in no distress.  HENT:  Head: Normocephalic and atraumatic.  Right Ear: External ear normal.  Left Ear: External ear normal.  Nose: Nose normal.  Mouth/Throat: Oropharynx is clear and moist. No oropharyngeal exudate.  TM within normal limits bilaterally.  Eyes: Conjunctivae are normal. Pupils are equal, round, and reactive to light.  Neck: Neck supple.  Cardiovascular: Normal rate, regular rhythm, normal heart sounds and intact distal pulses.   Pulmonary/Chest: Effort normal and breath sounds normal. No respiratory distress. She has no wheezes. She has no rales. She exhibits no tenderness.  Abdominal: Soft. Bowel sounds are normal. There is no tenderness.  Lymphadenopathy:    She has no cervical adenopathy.  Skin: Skin is warm and dry. No rash noted.  Psychiatric: Affect normal.    No results found for this or any previous visit (from the past 2160 hour(s)).  Assessment/Plan: Adult ADHD Will restart Vyvanse 40 mg daily.   ADRs discussed with patient.  Follow-up with PCP in 1 month.  Cough Possible silent reflux.  Will attempt 2 week trial of 40 mg Prilosec. If symptoms not improving, will need imaging and specialty referral.

## 2014-05-13 NOTE — Patient Instructions (Signed)
Increase your fluid intake.  Take Prilosec daily for 2 weeks to see if cough is stemming from silent reflux.  If symptoms are not improving, then we need to proceed with imaging and a specialty referral.  For ADD -- begin taking Vyvanse daily.  Follow-up with Dr. Charlett Blake in 1 month.  If you develop palpitations of difficulty sleeping, please stop medication and call the office.

## 2014-05-17 ENCOUNTER — Telehealth: Payer: Self-pay

## 2014-05-17 NOTE — Telephone Encounter (Signed)
PA for Vyvanse has been approved through 05-17-15.  Faxed a copy to pharmacy also

## 2014-07-27 NOTE — Telephone Encounter (Signed)
Patient states that pharmacy never received this fax.

## 2014-07-28 NOTE — Telephone Encounter (Signed)
Please inform patient that the pharmacy just needs to have the pharmacy run it again

## 2014-08-10 ENCOUNTER — Telehealth: Payer: Self-pay | Admitting: Family Medicine

## 2014-08-10 NOTE — Telephone Encounter (Signed)
Caller name: [place] Relation to pt: Call back number:[phone]   Reason for call: pt wants refill on Rx lisdexamfetamine (VYVANSE) 40 MG capsule.

## 2014-08-10 NOTE — Telephone Encounter (Signed)
Patient does not belong to me.  Was restarted at visit for acute concerns with myself.  She was due for a follow-up 1 month after starting.  No refill until follow-up with her PCP, Dr. Charlett Blake.

## 2014-08-10 NOTE — Telephone Encounter (Signed)
Last ov - 05/08/14 Last refilled- 05/13/14 #30 / 0 rf  UDS- none

## 2014-08-11 NOTE — Telephone Encounter (Signed)
See below

## 2014-08-11 NOTE — Telephone Encounter (Signed)
Please inform pt of Melody Wilcox's message

## 2014-08-26 ENCOUNTER — Ambulatory Visit (INDEPENDENT_AMBULATORY_CARE_PROVIDER_SITE_OTHER): Payer: BC Managed Care – PPO | Admitting: Family Medicine

## 2014-08-26 ENCOUNTER — Encounter: Payer: Self-pay | Admitting: Family Medicine

## 2014-08-26 VITALS — BP 114/76 | HR 66 | Temp 98.0°F | Ht 64.0 in | Wt 133.6 lb

## 2014-08-26 DIAGNOSIS — I10 Essential (primary) hypertension: Secondary | ICD-10-CM

## 2014-08-26 DIAGNOSIS — F909 Attention-deficit hyperactivity disorder, unspecified type: Secondary | ICD-10-CM

## 2014-08-26 DIAGNOSIS — K589 Irritable bowel syndrome without diarrhea: Secondary | ICD-10-CM

## 2014-08-26 DIAGNOSIS — M7711 Lateral epicondylitis, right elbow: Secondary | ICD-10-CM

## 2014-08-26 DIAGNOSIS — F900 Attention-deficit hyperactivity disorder, predominantly inattentive type: Secondary | ICD-10-CM

## 2014-08-26 MED ORDER — LISDEXAMFETAMINE DIMESYLATE 40 MG PO CAPS: 40.0000 mg | ORAL_CAPSULE | ORAL | Status: DC

## 2014-08-26 MED ORDER — LOSARTAN POTASSIUM 25 MG PO TABS: 25.0000 mg | ORAL_TABLET | Freq: Every day | ORAL | Status: DC

## 2014-08-26 NOTE — Progress Notes (Signed)
Pre visit review using our clinic review tool, if applicable. No additional management support is needed unless otherwise documented below in the visit note. 

## 2014-08-26 NOTE — Patient Instructions (Addendum)
Elderberry/Echinacea/Zinc liquid [email or web address] by [place] patches or gel   Lateral Epicondylitis (Tennis Elbow) with Rehab Lateral epicondylitis involves inflammation and pain around the outer portion of the elbow. The pain is caused by inflammation of the tendons in the forearm that bring back (extend) the wrist. Lateral epicondylitis is also called tennis elbow, because it is very common in tennis players. However, it may occur in any individual who extends the wrist repetitively. If lateral epicondylitis is left untreated, it may become a chronic problem. SYMPTOMS   Pain, tenderness, and inflammation on the outer (lateral) side of the elbow.  Pain or weakness with gripping activities.  Pain that increases with wrist-twisting motions (playing tennis, using a screwdriver, opening a door or a jar).  Pain with lifting objects, including a coffee cup. CAUSES  Lateral epicondylitis is caused by inflammation of the tendons that extend the wrist. Causes of injury may include:  Repetitive stress and strain on the muscles and tendons that extend the wrist.  Sudden change in activity level or intensity.  Incorrect grip in racquet sports.  Incorrect grip size of racquet (often too large).  Incorrect hitting position or technique (usually backhand, leading with the elbow).  Using a racket that is too heavy. RISK INCREASES WITH:  Sports or occupations that require repetitive and/or strenuous forearm and wrist movements (tennis, squash, racquetball, carpentry).  Poor wrist and forearm strength and flexibility.  Failure to warm up properly before activity.  Resuming activity before healing, rehabilitation, and conditioning are complete. PREVENTION   Warm up and stretch properly before activity.  Maintain physical fitness:  Strength, flexibility, and endurance.  Cardiovascular fitness.  Wear and use properly fitted equipment.  Learn and use proper technique  and have a coach correct improper technique.  Wear a tennis elbow (counterforce) brace. PROGNOSIS  The course of this condition depends on the degree of the injury. If treated properly, acute cases (symptoms lasting less than 4 weeks) are often resolved in 2 to 6 weeks. Chronic (longer lasting cases) often resolve in 3 to 6 months but may require physical therapy. RELATED COMPLICATIONS   Frequently recurring symptoms, resulting in a chronic problem. Properly treating the problem the first time decreases frequency of recurrence.  Chronic inflammation, scarring tendon degeneration, and partial tendon tear, requiring surgery.  Delayed healing or resolution of symptoms. TREATMENT  Treatment first involves the use of ice and medicine to reduce pain and inflammation. Strengthening and stretching exercises may help reduce discomfort if performed regularly. These exercises may be performed at home if the condition is an acute injury. Chronic cases may require a referral to a physical therapist for evaluation and treatment. Your caregiver may advise a corticosteroid injection to help reduce inflammation. Rarely, surgery is needed. MEDICATION  If pain medicine is needed, nonsteroidal anti-inflammatory medicines (aspirin and ibuprofen), or other minor pain relievers (acetaminophen), are often advised.  Do not take pain medicine for 7 days before surgery.  Prescription pain relievers may be given, if your caregiver thinks they are needed. Use only as directed and only as much as you need.  Corticosteroid injections may be recommended. These injections should be reserved only for the most severe cases, because they can only be given a certain number of times. HEAT AND COLD  Cold treatment (icing) should be applied for 10 to 15 minutes every 2 to 3 hours for inflammation and pain, and immediately after activity that aggravates your symptoms. Use ice packs or an ice massage.  Heat  treatment may be used  before performing stretching and strengthening activities prescribed by your caregiver, physical therapist, or athletic trainer. Use a heat pack or a warm water soak. SEEK MEDICAL CARE IF: Symptoms get worse or do not improve in 2 weeks, despite treatment. EXERCISES  RANGE OF MOTION (ROM) AND STRETCHING EXERCISES - Epicondylitis, Lateral (Tennis Elbow) These exercises may help you when beginning to rehabilitate your injury. Your symptoms may go away with or without further involvement from your physician, physical therapist, or athletic trainer. While completing these exercises, remember:   Restoring tissue flexibility helps normal motion to return to the joints. This allows healthier, less painful movement and activity.  An effective stretch should be held for at least 30 seconds.  A stretch should never be painful. You should only feel a gentle lengthening or release in the stretched tissue. RANGE OF MOTION - Wrist Flexion, Active-Assisted  Extend your right / left elbow with your fingers pointing down.*  Gently pull the back of your hand towards you, until you feel a gentle stretch on the top of your forearm.  Hold this position for __________ seconds. Repeat __________ times. Complete this exercise __________ times per day.  *If directed by your physician, physical therapist or athletic trainer, complete this stretch with your elbow bent, rather than extended. RANGE OF MOTION - Wrist Extension, Active-Assisted  Extend your right / left elbow and turn your palm upwards.*  Gently pull your palm and fingertips back, so your wrist extends and your fingers point more toward the ground.  You should feel a gentle stretch on the inside of your forearm.  Hold this position for __________ seconds. Repeat __________ times. Complete this exercise __________ times per day. *If directed by your physician, physical therapist or athletic trainer, complete this stretch with your elbow bent, rather  than extended. STRETCH - Wrist Flexion  Place the back of your right / left hand on a tabletop, leaving your elbow slightly bent. Your fingers should point away from your body.  Gently press the back of your hand down onto the table by straightening your elbow. You should feel a stretch on the top of your forearm.  Hold this position for __________ seconds. Repeat __________ times. Complete this stretch __________ times per day.  STRETCH - Wrist Extension   Place your right / left fingertips on a tabletop, leaving your elbow slightly bent. Your fingers should point backwards.  Gently press your fingers and palm down onto the table by straightening your elbow. You should feel a stretch on the inside of your forearm.  Hold this position for __________ seconds. Repeat __________ times. Complete this stretch __________ times per day.  STRENGTHENING EXERCISES - Epicondylitis, Lateral (Tennis Elbow) These exercises may help you when beginning to rehabilitate your injury. They may resolve your symptoms with or without further involvement from your physician, physical therapist, or athletic trainer. While completing these exercises, remember:   Muscles can gain both the endurance and the strength needed for everyday activities through controlled exercises.  Complete these exercises as instructed by your physician, physical therapist or athletic trainer. Increase the resistance and repetitions only as guided.  You may experience muscle soreness or fatigue, but the pain or discomfort you are trying to eliminate should never worsen during these exercises. If this pain does get worse, stop and make sure you are following the directions exactly. If the pain is still present after adjustments, discontinue the exercise until you can discuss the trouble with your caregiver.  STRENGTH - Wrist Flexors  Sit with your right / left forearm palm-up and fully supported on a table or countertop. Your elbow should  be resting below the height of your shoulder. Allow your wrist to extend over the edge of the surface.  Loosely holding a __________ weight, or a piece of rubber exercise band or tubing, slowly curl your hand up toward your forearm.  Hold this position for __________ seconds. Slowly lower the wrist back to the starting position in a controlled manner. Repeat __________ times. Complete this exercise __________ times per day.  STRENGTH - Wrist Extensors  Sit with your right / left forearm palm-down and fully supported on a table or countertop. Your elbow should be resting below the height of your shoulder. Allow your wrist to extend over the edge of the surface.  Loosely holding a __________ weight, or a piece of rubber exercise band or tubing, slowly curl your hand up toward your forearm.  Hold this position for __________ seconds. Slowly lower the wrist back to the starting position in a controlled manner. Repeat __________ times. Complete this exercise __________ times per day.  STRENGTH - Ulnar Deviators  Stand with a ____________________ weight in your right / left hand, or sit while holding a rubber exercise band or tubing, with your healthy arm supported on a table or countertop.  Move your wrist, so that your pinkie travels toward your forearm and your thumb moves away from your forearm.  Hold this position for __________ seconds and then slowly lower the wrist back to the starting position. Repeat __________ times. Complete this exercise __________ times per day STRENGTH - Radial Deviators  Stand with a ____________________ weight in your right / left hand, or sit while holding a rubber exercise band or tubing, with your injured arm supported on a table or countertop.  Raise your hand upward in front of you or pull up on the rubber tubing.  Hold this position for __________ seconds and then slowly lower the wrist back to the starting position. Repeat __________ times. Complete  this exercise __________ times per day. STRENGTH - Forearm Supinators   Sit with your right / left forearm supported on a table, keeping your elbow below shoulder height. Rest your hand over the edge, palm down.  Gently grip a hammer or a soup ladle.  Without moving your elbow, slowly turn your palm and hand upward to a "thumbs-up" position.  Hold this position for __________ seconds. Slowly return to the starting position. Repeat __________ times. Complete this exercise __________ times per day.  STRENGTH - Forearm Pronators   Sit with your right / left forearm supported on a table, keeping your elbow below shoulder height. Rest your hand over the edge, palm up.  Gently grip a hammer or a soup ladle.  Without moving your elbow, slowly turn your palm and hand upward to a "thumbs-up" position.  Hold this position for __________ seconds. Slowly return to the starting position. Repeat __________ times. Complete this exercise __________ times per day.  STRENGTH - Grip  Grasp a tennis ball, a dense sponge, or a large, rolled sock in your hand.  Squeeze as hard as you can, without increasing any pain.  Hold this position for __________ seconds. Release your grip slowly. Repeat __________ times. Complete this exercise __________ times per day.  STRENGTH - Elbow Extensors, Isometric  Stand or sit upright, on a firm surface. Place your right / left arm so that your palm faces your stomach, and  it is at the height of your waist.  Place your opposite hand on the underside of your forearm. Gently push up as your right / left arm resists. Push as hard as you can with both arms, without causing any pain or movement at your right / left elbow. Hold this stationary position for __________ seconds. Gradually release the tension in both arms. Allow your muscles to relax completely before repeating. Document Released: 09/30/2005 Document Revised: 02/14/2014 Document Reviewed: 01/12/2009 [facility]  Patient Information 2015 [place], [place]. This information is not intended to replace advice given to you by your health care provider. Make sure you discuss any questions you have with your health care provider.

## 2014-08-29 ENCOUNTER — Telehealth: Payer: Self-pay | Admitting: Family Medicine

## 2014-08-29 NOTE — Telephone Encounter (Signed)
Caller name: [place], [place] Relation to pt: self  Call back number: [phone]   Reason for call:  Pt states the MG has been changed regarding losartan (COZAAR) 25 MG tablet and wanted to know if this was accurate.

## 2014-08-29 NOTE — Telephone Encounter (Signed)
I do not see a change of dose on the Losartan in the computer, please confirm and notify patient. THX

## 2014-08-29 NOTE — Telephone Encounter (Signed)
Please advise? It looks like MD sent this in during visit on 08-26-14

## 2014-08-30 NOTE — Telephone Encounter (Signed)
I spoke to patient and she was very demanding, frustrated and screaming when I tried to get information from pt. I put the call on speaker so Dr Charlett Blake could hear also and pt was stating that she got Losartan 100 mg last time from Dr Charlett Blake and now Losartan 25 mg was called in to the pharmacy.  I tried to explain to the patient that we would have to contact the pharmacy to see why she got the 100 mg or she could call them because we have never prescribed this before. The patient went on to ask me if I thought she was stupid because she has the medication in front of her.   I tried to explain to patient that we didn't think she was stupid and then dr blyth took the phone.  Dr Charlett Blake tried to explain to pt and after about 5 minutes pt started to calm down a little.  Dr Charlett Blake informed the patient that the 25 mg was the appropriate dose and to continue taking this due to the fact of her not taking any medication the day of her appt and the BP being ok.  I called the pharmacy and spoke to Mickel Baas (the pharmacist) and she states that this was there mistake and they would contact the patient due to it being there mistake of giving the patient 100 mg of Losartan last month instead of the correct dose of 25 mg.

## 2014-08-30 NOTE — Telephone Encounter (Signed)
Pt returned your call please reach pt on home # [phone]

## 2014-08-30 NOTE — Telephone Encounter (Signed)
Left a message for patient to return my call. 

## 2014-09-04 ENCOUNTER — Encounter: Payer: Self-pay | Admitting: Family Medicine

## 2014-09-04 DIAGNOSIS — M7711 Lateral epicondylitis, right elbow: Secondary | ICD-10-CM

## 2014-09-04 HISTORY — DX: Lateral epicondylitis, right elbow: M77.11

## 2014-09-04 NOTE — Progress Notes (Signed)
Melody Wilcox  [id number] 28-Nov-1965 09/04/2014      Progress Note-Follow Up  Subjective  Chief Complaint  Chief Complaint  Patient presents with  . Follow-up    medication check    HPI  Patient is a 48 y.o. female in today for routine medical care. Generally feeling well although she has been struggling with right elbow pain recently. Denies any injury or redness or warmth. No recent illness. Denies CP/palp/SOB/HA/congestion/fevers/GI or GU c/o. Taking meds as prescribed  Past Medical History  Diagnosis Date  . Hyperlipidemia   . Hypertension   . Chicken pox middle school  . IBS (irritable bowel syndrome) 2002  . Enuresis     childhood, resolved  . Allergy     PCN, sulfa, Lidocaine cream and morphine  . History of cervical cancer 12/15/2013  . Lateral epicondylitis of right elbow 09/04/2014    Past Surgical History  Procedure Laterality Date  . Radical hysterectomy    . Breast reduction surgery  2008  . Abdominal hysterectomy      Family History  Problem Relation Age of Onset  . Cancer Mother 57    breast cancer  . Fibromyalgia Mother   . Hypertension Father   . Heart disease Father   . Hypertension Paternal Uncle   . Gout Brother   . Cancer Maternal Grandmother 107    breast  . Diabetes Maternal Grandfather     type 2  . Heart disease Paternal Grandmother   . Other Paternal Grandfather     brain tumor  . Gout Sister     History   Social History  . Marital Status: Married    Spouse Name: N/A    Number of Children: N/A  . Years of Education: N/A   Occupational History  . Not on file.   Social History Main Topics  . Smoking status: Never Smoker   . Smokeless tobacco: Never Used  . Alcohol Use: 4.2 oz/week    7 Glasses of wine per week     Comment: socially -- glass of wine a ngiht.  . Drug Use: No  . Sexual Activity: Yes    Birth Control/ Protection: Surgical     Comment: live with   Other Topics Concern  . Not on file   Social History  Narrative    Current Outpatient Prescriptions on File Prior to Visit  Medication Sig Dispense Refill  . fluticasone (FLONASE) 50 MCG/ACT nasal spray Place 2 sprays into both nostrils daily as needed for allergies or rhinitis.     No current facility-administered medications on file prior to visit.    Allergies  Allergen Reactions  . Morphine And Related   . Penicillins   . Sulfa Antibiotics   . Tape     Review of Systems  Review of Systems  Constitutional: Negative for fever and malaise/fatigue.  HENT: Negative for congestion.   Eyes: Negative for discharge.  Respiratory: Negative for shortness of breath.   Cardiovascular: Negative for chest pain, palpitations and leg swelling.  Gastrointestinal: Negative for nausea, abdominal pain and diarrhea.  Genitourinary: Negative for dysuria.  Musculoskeletal: Positive for joint pain. Negative for falls.       Right elbow pain, off and on for a couple weeks, denies an injury  Skin: Negative for rash.  Neurological: Negative for loss of consciousness and headaches.  Endo/Heme/Allergies: Negative for polydipsia.  Psychiatric/Behavioral: Negative for depression and suicidal ideas. The patient is not nervous/anxious and does not have insomnia.  Objective  BP 114/76 mmHg  Pulse 66  Temp(Src) 98 F (36.7 C) (Oral)  Ht 5\' 4"  (1.626 m)  Wt 133 lb 9.6 oz (60.601 kg)  BMI 22.92 kg/m2  SpO2 100%  Physical Exam  Physical Exam  Constitutional: She is oriented to person, place, and time and well-developed, well-nourished, and in no distress. No distress.  HENT:  Head: Normocephalic and atraumatic.  Eyes: Conjunctivae are normal.  Neck: Neck supple. No thyromegaly present.  Cardiovascular: Normal rate, regular rhythm and normal heart sounds.   No murmur heard. Pulmonary/Chest: Effort normal and breath sounds normal. She has no wheezes.  Abdominal: She exhibits no distension and no mass.  Musculoskeletal: She exhibits no edema.    Lymphadenopathy:    She has no cervical adenopathy.  Neurological: She is alert and oriented to person, place, and time.  Skin: Skin is warm and dry. No rash noted. She is not diaphoretic.  Psychiatric: Memory, affect and judgment normal.    No results found for: TSH No results found for: WBC, HGB, HCT, MCV, PLT No results found for: CREATININE, BUN, NA, K, CL, CO2 Lab Results  Component Value Date   ALT 14 12/13/2013   AST 17 12/13/2013   ALKPHOS 43 12/13/2013   BILITOT 0.3 12/13/2013   No results found for: CHOL No results found for: HDL No results found for: LDLCALC No results found for: TRIG No results found for: CHOLHDL   Assessment & Plan  HTN (hypertension) Well controlled, no changes to meds. Encouraged heart healthy diet such as the DASH diet and exercise as tolerated.   Adult ADHD Doing well on current meds may continue, allowed refills  IBS (irritable bowel syndrome) Avoid offending foods, start probiotics. Do not eat large meals in late evening and consider raising head of bed.   Lateral epicondylitis of right elbow Ice and Salon pas bid report if no improvement for referral

## 2014-09-04 NOTE — Assessment & Plan Note (Signed)
Avoid offending foods, start probiotics. Do not eat large meals in late evening and consider raising head of bed.  

## 2014-09-04 NOTE — Assessment & Plan Note (Signed)
Doing well on current meds may continue, allowed refills

## 2014-09-04 NOTE — Assessment & Plan Note (Signed)
Well controlled, no changes to meds. Encouraged heart healthy diet such as the DASH diet and exercise as tolerated.  °

## 2014-09-04 NOTE — Assessment & Plan Note (Signed)
Ice and Salon pas bid report if no improvement for referral

## 2014-11-21 ENCOUNTER — Ambulatory Visit (INDEPENDENT_AMBULATORY_CARE_PROVIDER_SITE_OTHER): Payer: BLUE CROSS/BLUE SHIELD | Admitting: Physician Assistant

## 2014-11-21 ENCOUNTER — Encounter: Payer: Self-pay | Admitting: Physician Assistant

## 2014-11-21 VITALS — BP 150/92 | HR 79 | Temp 98.2°F | Resp 16 | Ht 64.0 in | Wt 130.4 lb

## 2014-11-21 DIAGNOSIS — B9689 Other specified bacterial agents as the cause of diseases classified elsewhere: Secondary | ICD-10-CM

## 2014-11-21 DIAGNOSIS — J019 Acute sinusitis, unspecified: Secondary | ICD-10-CM

## 2014-11-21 DIAGNOSIS — B029 Zoster without complications: Secondary | ICD-10-CM

## 2014-11-21 MED ORDER — VALACYCLOVIR HCL 1 G PO TABS: 1000.0000 mg | ORAL_TABLET | Freq: Three times a day (TID) | ORAL | Status: DC

## 2014-11-21 MED ORDER — VALACYCLOVIR HCL 1 G PO TABS: 1000.0000 mg | ORAL_TABLET | Freq: Two times a day (BID) | ORAL | Status: DC

## 2014-11-21 MED ORDER — HYDROCOD POLST-CHLORPHEN POLST 10-8 MG/5ML PO LQCR: 5.0000 mL | Freq: Two times a day (BID) | ORAL | Status: DC | PRN

## 2014-11-21 MED ORDER — LEVOFLOXACIN 750 MG PO TABS: 750.0000 mg | ORAL_TABLET | Freq: Every day | ORAL | Status: DC

## 2014-11-21 NOTE — Assessment & Plan Note (Signed)
Rx Valtrex.  Prognosis of rash discussed.  She has been directed to keep rash covered as is contagious.  Follow-up PRN.

## 2014-11-21 NOTE — Patient Instructions (Signed)
Please take antibiotic as directed.  Increase fluid intake.  Use Saline nasal spray.  Take a daily multivitamin. Use Tussionex for cough.  Place a humidifier in the bedroom. Take the Valtrex three times daily as directed.  Keep the rash covered.  Please call or return clinic if symptoms are not improving.  Sinusitis Sinusitis is redness, soreness, and swelling (inflammation) of the paranasal sinuses. Paranasal sinuses are air pockets within the bones of your face (beneath the eyes, the middle of the forehead, or above the eyes). In healthy paranasal sinuses, mucus is able to drain out, and air is able to circulate through them by way of your nose. However, when your paranasal sinuses are inflamed, mucus and air can become trapped. This can allow bacteria and other germs to grow and cause infection. Sinusitis can develop quickly and last only a short time (acute) or continue over a long period (chronic). Sinusitis that lasts for more than 12 weeks is considered chronic.  CAUSES  Causes of sinusitis include:  Allergies.  Structural abnormalities, such as displacement of the cartilage that separates your nostrils (deviated septum), which can decrease the air flow through your nose and sinuses and affect sinus drainage.  Functional abnormalities, such as when the small hairs (cilia) that line your sinuses and help remove mucus do not work properly or are not present. SYMPTOMS  Symptoms of acute and chronic sinusitis are the same. The primary symptoms are pain and pressure around the affected sinuses. Other symptoms include:  Upper toothache.  Earache.  Headache.  Bad breath.  Decreased sense of smell and taste.  A cough, which worsens when you are lying flat.  Fatigue.  Fever.  Thick drainage from your nose, which often is green and may contain pus (purulent).  Swelling and warmth over the affected sinuses. DIAGNOSIS  Your caregiver will perform a physical exam. During the exam, your  caregiver may:  Look in your nose for signs of abnormal growths in your nostrils (nasal polyps).  Tap over the affected sinus to check for signs of infection.  View the inside of your sinuses (endoscopy) with a special imaging device with a light attached (endoscope), which is inserted into your sinuses. If your caregiver suspects that you have chronic sinusitis, one or more of the following tests may be recommended:  Allergy tests.  Nasal culture A sample of mucus is taken from your nose and sent to a lab and screened for bacteria.  Nasal cytology A sample of mucus is taken from your nose and examined by your caregiver to determine if your sinusitis is related to an allergy. TREATMENT  Most cases of acute sinusitis are related to a viral infection and will resolve on their own within 10 days. Sometimes medicines are prescribed to help relieve symptoms (pain medicine, decongestants, nasal steroid sprays, or saline sprays).  However, for sinusitis related to a bacterial infection, your caregiver will prescribe antibiotic medicines. These are medicines that will help kill the bacteria causing the infection.  Rarely, sinusitis is caused by a fungal infection. In theses cases, your caregiver will prescribe antifungal medicine. For some cases of chronic sinusitis, surgery is needed. Generally, these are cases in which sinusitis recurs more than 3 times per year, despite other treatments. HOME CARE INSTRUCTIONS   Drink plenty of water. Water helps thin the mucus so your sinuses can drain more easily.  Use a humidifier.  Inhale steam 3 to 4 times a day (for example, sit in the bathroom with the  shower running).  Apply a warm, moist washcloth to your face 3 to 4 times a day, or as directed by your caregiver.  Use saline nasal sprays to help moisten and clean your sinuses.  Take over-the-counter or prescription medicines for pain, discomfort, or fever only as directed by your caregiver. SEEK  IMMEDIATE MEDICAL CARE IF:  You have increasing pain or severe headaches.  You have nausea, vomiting, or drowsiness.  You have swelling around your face.  You have vision problems.  You have a stiff neck.  You have difficulty breathing. MAKE SURE YOU:   Understand these instructions.  Will watch your condition.  Will get help right away if you are not doing well or get worse. Document Released: 09/30/2005 Document Revised: 12/23/2011 Document Reviewed: 10/15/2011 [facility] Patient Information 2014 [place], [place].

## 2014-11-21 NOTE — Progress Notes (Signed)
Patient presents to clinic today c/o sinus pressure, sinus pain, ear pain and facial pain x 5 days. Endorses sore throat and PND.  Denies fever, chest pain or SOB.  Endorses mild cough.  Has taken Nyquil with some relief of symptoms.  Patient endorses pruritic and tingling rash of left anterior chest since this morning.  Endorses history of chicken pox but denies hx of shingles rash.  Denies change to lotions, detergents of hygiene product.  Past Medical History  Diagnosis Date  . Hyperlipidemia   . Hypertension   . Chicken pox middle school  . IBS (irritable bowel syndrome) 2002  . Enuresis     childhood, resolved  . Allergy     PCN, sulfa, Lidocaine cream and morphine  . History of cervical cancer 12/15/2013  . Lateral epicondylitis of right elbow 09/04/2014    Current Outpatient Prescriptions on File Prior to Visit  Medication Sig Dispense Refill  . fluticasone (FLONASE) 50 MCG/ACT nasal spray Place 2 sprays into both nostrils daily as needed for allergies or rhinitis.    Marland Kitchen lisdexamfetamine (VYVANSE) 40 MG capsule Take 1 capsule (40 mg total) by mouth every morning. January 2016 rx (Patient taking differently: Take 40 mg by mouth daily as needed. January 2016 rx) 30 capsule 0  . losartan (COZAAR) 25 MG tablet Take 1 tablet (25 mg total) by mouth daily. HTN 30 tablet 5   No current facility-administered medications on file prior to visit.    Allergies  Allergen Reactions  . Morphine And Related   . Other Swelling    Numbing Creams  . Penicillins   . Sulfa Antibiotics   . Tape     Family History  Problem Relation Age of Onset  . Cancer Mother 54    breast cancer  . Fibromyalgia Mother   . Hypertension Father   . Heart disease Father   . Hypertension Paternal Uncle   . Gout Brother   . Cancer Maternal Grandmother 9    breast  . Diabetes Maternal Grandfather     type 2  . Heart disease Paternal Grandmother   . Other Paternal Grandfather     brain tumor  . Gout  Sister     History   Social History  . Marital Status: Married    Spouse Name: N/A    Number of Children: N/A  . Years of Education: N/A   Social History Main Topics  . Smoking status: Never Smoker   . Smokeless tobacco: Never Used  . Alcohol Use: 4.2 oz/week    7 Glasses of wine per week     Comment: socially -- glass of wine a ngiht.  . Drug Use: No  . Sexual Activity: Yes    Birth Control/ Protection: Surgical     Comment: live with   Other Topics Concern  . None   Social History Narrative   Review of Systems - See HPI.  All other ROS are negative.  BP 150/92 mmHg  Pulse 79  Temp(Src) 98.2 F (36.8 C) (Oral)  Resp 16  Ht 5\' 4"  (1.626 m)  Wt 130 lb 6 oz (59.138 kg)  BMI 22.37 kg/m2  SpO2 100%  Physical Exam  Constitutional: She is oriented to person, place, and time and well-developed, well-nourished, and in no distress.  HENT:  Head: Normocephalic and atraumatic.  Right Ear: Tympanic membrane and ear canal normal.  Left Ear: Tympanic membrane and ear canal normal.  Nose: Mucosal edema present. Right sinus exhibits frontal  sinus tenderness. Left sinus exhibits frontal sinus tenderness.  Mouth/Throat: Uvula is midline, oropharynx is clear and moist and mucous membranes are normal.  Eyes: Conjunctivae are normal.  Neck: Neck supple.  Cardiovascular: Normal rate, regular rhythm, normal heart sounds and intact distal pulses.   Pulmonary/Chest: Effort normal and breath sounds normal. No respiratory distress. She has no wheezes. She has no rales. She exhibits no tenderness.  Neurological: She is alert and oriented to person, place, and time.  Skin: Skin is warm and dry.     Psychiatric: Affect normal.  Vitals reviewed.  Assessment/Plan: Acute bacterial sinusitis Rx Levaquin.  Increase fluids.  Rest.  Saline nasal spray.  Probiotic.  Mucinex as directed.  Humidifier in bedroom.  Call or return to clinic if symptoms are not improving.    Shingles rash Rx  Valtrex.  Prognosis of rash discussed.  She has been directed to keep rash covered as is contagious.  Follow-up PRN.

## 2014-11-21 NOTE — Assessment & Plan Note (Signed)
Rx Levaquin.  Increase fluids.  Rest.  Saline nasal spray.  Probiotic.  Mucinex as directed.  Humidifier in bedroom.  Call or return to clinic if symptoms are not improving.

## 2014-11-21 NOTE — Progress Notes (Signed)
Pre visit review using our clinic review tool, if applicable. No additional management support is needed unless otherwise documented below in the visit note/SLS  

## 2015-02-01 ENCOUNTER — Other Ambulatory Visit: Payer: Self-pay | Admitting: Family Medicine

## 2015-02-02 NOTE — Telephone Encounter (Signed)
I have approved #[id number] with 1 rf but give her the heads up that I will need to see her roughly twice a year to keep this going so would benefit from another appt before too long

## 2015-02-02 NOTE — Telephone Encounter (Signed)
Patient called back stating that she is starting a new job and will have to call back to schedule appointment

## 2015-02-02 NOTE — Telephone Encounter (Signed)
Last Office Visit 08/26/14 Last Refill 08/26/14  #30 with  1 Refill. Advise on Trazodone refill

## 2015-02-02 NOTE — Telephone Encounter (Signed)
Called the patient left a detailed message of PCP instructions on refill.

## 2015-03-09 ENCOUNTER — Ambulatory Visit (INDEPENDENT_AMBULATORY_CARE_PROVIDER_SITE_OTHER): Payer: BLUE CROSS/BLUE SHIELD | Admitting: Family Medicine

## 2015-03-09 ENCOUNTER — Encounter: Payer: Self-pay | Admitting: Family Medicine

## 2015-03-09 VITALS — BP 122/70 | HR 65 | Temp 98.3°F | Ht 63.0 in | Wt 128.1 lb

## 2015-03-09 DIAGNOSIS — K5900 Constipation, unspecified: Secondary | ICD-10-CM

## 2015-03-09 DIAGNOSIS — E785 Hyperlipidemia, unspecified: Secondary | ICD-10-CM | POA: Diagnosis not present

## 2015-03-09 DIAGNOSIS — F900 Attention-deficit hyperactivity disorder, predominantly inattentive type: Secondary | ICD-10-CM

## 2015-03-09 DIAGNOSIS — F909 Attention-deficit hyperactivity disorder, unspecified type: Secondary | ICD-10-CM

## 2015-03-09 DIAGNOSIS — M25511 Pain in right shoulder: Secondary | ICD-10-CM

## 2015-03-09 DIAGNOSIS — G4700 Insomnia, unspecified: Secondary | ICD-10-CM

## 2015-03-09 DIAGNOSIS — I10 Essential (primary) hypertension: Secondary | ICD-10-CM | POA: Diagnosis not present

## 2015-03-09 MED ORDER — LISDEXAMFETAMINE DIMESYLATE 40 MG PO CAPS: 40.0000 mg | ORAL_CAPSULE | ORAL | Status: DC

## 2015-03-09 MED ORDER — TRAZODONE HCL 50 MG PO TABS: ORAL_TABLET | ORAL | Status: DC

## 2015-03-09 MED ORDER — LOSARTAN POTASSIUM 25 MG PO TABS: 25.0000 mg | ORAL_TABLET | Freq: Every day | ORAL | Status: DC

## 2015-03-09 NOTE — Patient Instructions (Addendum)
Call to have lab work ordered the week before your annual exam, lipid, cmp, cbc, tsh, hyperlipidemia mixed, HTN, ess   Hypertension Hypertension, commonly called high blood pressure, is when the force of blood pumping through your arteries is too strong. Your arteries are the blood vessels that carry blood from your heart throughout your body. A blood pressure reading consists of a higher number over a lower number, such as 110/72. The higher number (systolic) is the pressure inside your arteries when your heart pumps. The lower number (diastolic) is the pressure inside your arteries when your heart relaxes. Ideally you want your blood pressure below 120/80. Hypertension forces your heart to work harder to pump blood. Your arteries may become narrow or stiff. Having hypertension puts you at risk for heart disease, stroke, and other problems.  RISK FACTORS Some risk factors for high blood pressure are controllable. Others are not.  Risk factors you cannot control include:   Race. You may be at higher risk if you are African American.  Age. Risk increases with age.  Gender. Men are at higher risk than women before age 12 years. After age 69, women are at higher risk than men. Risk factors you can control include:  Not getting enough exercise or physical activity.  Being overweight.  Getting too much fat, sugar, calories, or salt in your diet.  Drinking too much alcohol. SIGNS AND SYMPTOMS Hypertension does not usually cause signs or symptoms. Extremely high blood pressure (hypertensive crisis) may cause headache, anxiety, shortness of breath, and nosebleed. DIAGNOSIS  To check if you have hypertension, your health care provider will measure your blood pressure while you are seated, with your arm held at the level of your heart. It should be measured at least twice using the same arm. Certain conditions can cause a difference in blood pressure between your right and left arms. A blood  pressure reading that is higher than normal on one occasion does not mean that you need treatment. If one blood pressure reading is high, ask your health care provider about having it checked again. TREATMENT  Treating high blood pressure includes making lifestyle changes and possibly taking medicine. Living a healthy lifestyle can help lower high blood pressure. You may need to change some of your habits. Lifestyle changes may include:  Following the DASH diet. This diet is high in fruits, vegetables, and whole grains. It is low in salt, red meat, and added sugars.  Getting at least 2 hours of brisk physical activity every week.  Losing weight if necessary.  Not smoking.  Limiting alcoholic beverages.  Learning ways to reduce stress. If lifestyle changes are not enough to get your blood pressure under control, your health care provider may prescribe medicine. You may need to take more than one. Work closely with your health care provider to understand the risks and benefits. HOME CARE INSTRUCTIONS  Have your blood pressure rechecked as directed by your health care provider.   Take medicines only as directed by your health care provider. Follow the directions carefully. Blood pressure medicines must be taken as prescribed. The medicine does not work as well when you skip doses. Skipping doses also puts you at risk for problems.   Do not smoke.   Monitor your blood pressure at home as directed by your health care provider. SEEK MEDICAL CARE IF:   You think you are having a reaction to medicines taken.  You have recurrent headaches or feel dizzy.  You have swelling in  your ankles.  You have trouble with your vision. SEEK IMMEDIATE MEDICAL CARE IF:  You develop a severe headache or confusion.  You have unusual weakness, numbness, or feel faint.  You have severe chest or abdominal pain.  You vomit repeatedly.  You have trouble breathing. MAKE SURE YOU:   Understand  these instructions.  Will watch your condition.  Will get help right away if you are not doing well or get worse. Document Released: 09/30/2005 Document Revised: 02/14/2014 Document Reviewed: 07/23/2013 [facility] Patient Information 2015 [place], [place]. This information is not intended to replace advice given to you by your health care provider. Make sure you discuss any questions you have with your health care provider.

## 2015-03-09 NOTE — Progress Notes (Signed)
Pre visit review using our clinic review tool, if applicable. No additional management support is needed unless otherwise documented below in the visit note. 

## 2015-03-10 ENCOUNTER — Encounter: Payer: Self-pay | Admitting: Family Medicine

## 2015-03-16 ENCOUNTER — Other Ambulatory Visit: Payer: Self-pay

## 2015-03-16 DIAGNOSIS — Z1231 Encounter for screening mammogram for malignant neoplasm of breast: Secondary | ICD-10-CM

## 2015-03-19 ENCOUNTER — Encounter: Payer: Self-pay | Admitting: Family Medicine

## 2015-03-19 DIAGNOSIS — K5900 Constipation, unspecified: Secondary | ICD-10-CM

## 2015-03-19 DIAGNOSIS — M25519 Pain in unspecified shoulder: Secondary | ICD-10-CM | POA: Insufficient documentation

## 2015-03-19 HISTORY — DX: Constipation, unspecified: K59.00

## 2015-03-19 NOTE — Assessment & Plan Note (Signed)
Restarted Vyvanse today at patient request

## 2015-03-19 NOTE — Assessment & Plan Note (Signed)
Encouraged moist heat and gentle stretching as tolerated. May try NSAIDs and prescription meds as directed and report if symptoms worsen or seek immediate care. Referred to sports medicine for further consideration.  

## 2015-03-19 NOTE — Assessment & Plan Note (Signed)
Encouraged good sleep hygiene such as dark, quiet room. No blue/green glowing lights such as computer screens in bedroom. No alcohol or stimulants in evening. Cut down on caffeine as able. Regular exercise is helpful but not just prior to bed time. Trazodone helpful, refill given

## 2015-03-19 NOTE — Progress Notes (Signed)
Melody Wilcox  [id number] 12-Feb-1966 03/19/2015      Progress Note-Follow Up  Subjective  Chief Complaint  Chief Complaint  Patient presents with  . Neck Pain  . Shoulder Pain    HPI  Patient is a 49 y.o. female in today for routine medical care. Patient is in today for evaluation of numerous concerns. Is noting she would like to restart Vyvanse which worked well and she had no side effects with. Needs a refill on her trazodone which also works well. Is complaining of right-sided neck and shoulder pain for the last several months. Denies any obvious injury. No radicular symptoms. Pain is primarily on the right side of the neck and the shoulder. She is also complaining of some fatigue but no recent fevers or illness. Has some mild constipation moving her bowels with straining every couple of days. Has had some mild epigastric discomfort at times. Denies CP/palp/SOB/HA/congestion/fevers or GU c/o. Taking meds as prescribed to get  Past Medical History  Diagnosis Date  . Hyperlipidemia   . Hypertension   . Chicken pox middle school  . IBS (irritable bowel syndrome) 2002  . Enuresis     childhood, resolved  . Allergy     PCN, sulfa, Lidocaine cream and morphine  . History of cervical cancer 12/15/2013  . Lateral epicondylitis of right elbow 09/04/2014    Past Surgical History  Procedure Laterality Date  . Radical hysterectomy    . Breast reduction surgery  2008  . Abdominal hysterectomy      Family History  Problem Relation Age of Onset  . Cancer Mother 63    breast cancer  . Fibromyalgia Mother   . Hypertension Father   . Heart disease Father   . Hypertension Paternal Uncle   . Gout Brother   . Cancer Maternal Grandmother 23    breast  . Diabetes Maternal Grandfather     type 2  . Heart disease Paternal Grandmother   . Other Paternal Grandfather     brain tumor  . Gout Sister     History   Social History  . Marital Status: Married    Spouse Name: N/A  .  Number of Children: N/A  . Years of Education: N/A   Occupational History  . Not on file.   Social History Main Topics  . Smoking status: Never Smoker   . Smokeless tobacco: Never Used  . Alcohol Use: 4.2 oz/week    7 Glasses of wine per week     Comment: socially -- glass of wine a ngiht.  . Drug Use: No  . Sexual Activity: Yes    Birth Control/ Protection: Surgical     Comment: live with   Other Topics Concern  . Not on file   Social History Narrative    Current Outpatient Prescriptions on File Prior to Visit  Medication Sig Dispense Refill  . fluticasone (FLONASE) 50 MCG/ACT nasal spray Place 2 sprays into both nostrils daily as needed for allergies or rhinitis.     No current facility-administered medications on file prior to visit.    Allergies  Allergen Reactions  . Morphine And Related   . Other Swelling    Numbing Creams  . Penicillins   . Sulfa Antibiotics   . Tape     Review of Systems  Review of Systems  Constitutional: Negative for fever and malaise/fatigue.  HENT: Negative for congestion.   Eyes: Negative for discharge.  Respiratory: Negative for shortness of breath.  Cardiovascular: Negative for chest pain, palpitations and leg swelling.  Gastrointestinal: Positive for abdominal pain and constipation. Negative for nausea and diarrhea.  Genitourinary: Negative for dysuria.  Musculoskeletal: Positive for myalgias and joint pain. Negative for falls.  Skin: Negative for rash.  Neurological: Negative for loss of consciousness and headaches.  Endo/Heme/Allergies: Negative for polydipsia.  Psychiatric/Behavioral: Negative for depression and suicidal ideas. The patient is not nervous/anxious and does not have insomnia.     Objective  BP 122/70 mmHg  Pulse 65  Temp(Src) 98.3 F (36.8 C) (Oral)  Ht 5\' 3"  (1.6 m)  Wt 128 lb 2 oz (58.117 kg)  BMI 22.70 kg/m2  SpO2 97%  Physical Exam  Physical Exam  Constitutional: She is oriented to person,  place, and time and well-developed, well-nourished, and in no distress. No distress.  HENT:  Head: Normocephalic and atraumatic.  Eyes: Conjunctivae are normal.  Neck: Neck supple. No thyromegaly present.  Cardiovascular: Normal rate, regular rhythm and normal heart sounds.   No murmur heard. Pulmonary/Chest: Effort normal and breath sounds normal. She has no wheezes.  Abdominal: She exhibits no distension and no mass.  Musculoskeletal: She exhibits no edema.  Lymphadenopathy:    She has no cervical adenopathy.  Neurological: She is alert and oriented to person, place, and time.  Skin: Skin is warm and dry. No rash noted. She is not diaphoretic.  Psychiatric: Memory, affect and judgment normal.    No results found for: TSH No results found for: WBC, HGB, HCT, MCV, PLT No results found for: CREATININE, BUN, NA, K, CL, CO2 Lab Results  Component Value Date   ALT 14 12/13/2013   AST 17 12/13/2013   ALKPHOS 43 12/13/2013   BILITOT 0.3 12/13/2013     Assessment & Plan  HTN (hypertension) Well controlled, no changes to meds. Encouraged heart healthy diet such as the DASH diet and exercise as tolerated.    Adult ADHD Restarted Vyvanse today at patient request   Hyperlipidemia Encouraged heart healthy diet, increase exercise, avoid trans fats, consider a krill oil cap daily   Insomnia Encouraged good sleep hygiene such as dark, quiet room. No blue/green glowing lights such as computer screens in bedroom. No alcohol or stimulants in evening. Cut down on caffeine as able. Regular exercise is helpful but not just prior to bed time. Trazodone helpful, refill given   Pain in joint, shoulder region Encouraged moist heat and gentle stretching as tolerated. May try NSAIDs and prescription meds as directed and report if symptoms worsen or seek immediate care. Referred to sports medicine for further consideration.    Constipation Encouraged increased hydration and fiber in diet.  Daily probiotics. If bowels not moving can use MOM 2 tbls po in 4 oz of warm prune juice by mouth every 2-3 days. If no results then repeat in 4 hours with  Dulcolax suppository pr, may repeat again in 4 more hours as needed. Seek care if symptoms worsen. Consider daily Miralax and/or Dulcolax if symptoms persist.

## 2015-03-19 NOTE — Assessment & Plan Note (Signed)
Encouraged heart healthy diet, increase exercise, avoid trans fats, consider a krill oil cap daily 

## 2015-03-19 NOTE — Assessment & Plan Note (Signed)
Encouraged increased hydration and fiber in diet. Daily probiotics. If bowels not moving can use MOM 2 tbls po in 4 oz of warm prune juice by mouth every 2-3 days. If no results then repeat in 4 hours with  Dulcolax suppository pr, may repeat again in 4 more hours as needed. Seek care if symptoms worsen. Consider daily Miralax and/or Dulcolax if symptoms persist.  

## 2015-03-19 NOTE — Assessment & Plan Note (Signed)
Well controlled, no changes to meds. Encouraged heart healthy diet such as the DASH diet and exercise as tolerated.  °

## 2015-03-28 ENCOUNTER — Ambulatory Visit: Payer: BLUE CROSS/BLUE SHIELD | Admitting: Family Medicine

## 2015-04-05 ENCOUNTER — Encounter: Payer: Self-pay | Admitting: Family Medicine

## 2015-04-05 ENCOUNTER — Ambulatory Visit (INDEPENDENT_AMBULATORY_CARE_PROVIDER_SITE_OTHER): Payer: BLUE CROSS/BLUE SHIELD

## 2015-04-05 ENCOUNTER — Ambulatory Visit (INDEPENDENT_AMBULATORY_CARE_PROVIDER_SITE_OTHER): Payer: BLUE CROSS/BLUE SHIELD | Admitting: Family Medicine

## 2015-04-05 ENCOUNTER — Ambulatory Visit (INDEPENDENT_AMBULATORY_CARE_PROVIDER_SITE_OTHER)
Admission: RE | Admit: 2015-04-05 | Discharge: 2015-04-05 | Disposition: A | Discharge status: Home / Self Care | Payer: BLUE CROSS/BLUE SHIELD | Source: Ambulatory Visit | Attending: Family Medicine | Admitting: Family Medicine

## 2015-04-05 ENCOUNTER — Ambulatory Visit
Admission: RE | Admit: 2015-04-05 | Discharge: 2015-04-05 | Disposition: A | Discharge status: Home / Self Care | Payer: BLUE CROSS/BLUE SHIELD | Source: Ambulatory Visit

## 2015-04-05 VITALS — BP 130/82 | HR 65 | Ht 63.0 in | Wt 125.0 lb

## 2015-04-05 DIAGNOSIS — M6520 Calcific tendinitis, unspecified site: Secondary | ICD-10-CM

## 2015-04-05 DIAGNOSIS — M9908 Segmental and somatic dysfunction of rib cage: Secondary | ICD-10-CM | POA: Diagnosis not present

## 2015-04-05 DIAGNOSIS — M7530 Calcific tendinitis of unspecified shoulder: Secondary | ICD-10-CM

## 2015-04-05 DIAGNOSIS — M999 Biomechanical lesion, unspecified: Secondary | ICD-10-CM

## 2015-04-05 DIAGNOSIS — M9902 Segmental and somatic dysfunction of thoracic region: Secondary | ICD-10-CM | POA: Diagnosis not present

## 2015-04-05 DIAGNOSIS — Z1231 Encounter for screening mammogram for malignant neoplasm of breast: Secondary | ICD-10-CM

## 2015-04-05 DIAGNOSIS — M25511 Pain in right shoulder: Secondary | ICD-10-CM

## 2015-04-05 DIAGNOSIS — M940 Chondrocostal junction syndrome [Tietze]: Secondary | ICD-10-CM

## 2015-04-05 DIAGNOSIS — M9901 Segmental and somatic dysfunction of cervical region: Secondary | ICD-10-CM

## 2015-04-05 NOTE — Patient Instructions (Signed)
Good to see you.  Ice 20 minutes 2 times daily. Usually after activity and before bed. pennsaid pinkie amount topically 2 times daily as needed.  We will get xrays downstairs today Vitamin D 2000 IU daily Exercises 3 times a week.  Sleep with right arm down if possible See me again in 3 weeks.

## 2015-04-05 NOTE — Assessment & Plan Note (Signed)
She does have a calcific bursitis noted under the subscapularis tendon. I do believe that this could be contributing to some of her discomfort. We discussed the possibility of an injection which we will consider at a later date. Patient try some mild home exercises that he went over with the athletic trainer. We discussed icing regimen. We also discussed topical anti-inflammatory's and vitamin D supplementation. Patient come back and see me again in 3 weeks for further evaluation and treatment.

## 2015-04-05 NOTE — Assessment & Plan Note (Signed)
Decision today to treat with OMT was based on Physical Exam  After verbal consent patient was treated with HVLA, ME techniques in cervical, thoracic rib areas  Patient tolerated the procedure well with improvement in symptoms  Patient given exercises, stretches and lifestyle modifications  See medications in patient instructions if given  Patient will follow up in 3 weeks

## 2015-04-05 NOTE — Progress Notes (Signed)
Pre visit review using our clinic review tool, if applicable. No additional management support is needed unless otherwise documented below in the visit note. 

## 2015-04-05 NOTE — Progress Notes (Signed)
[facility] [address], [place] [address] Phone: [phone] Subjective:    I'm seeing this patient by the request  of:  BLYTH, Erline Levine, MD   CC: right shoulder  Melody Wilcox is a 49 y.o. female coming in with complaint of right shoulder pain. Patient states that she has had this pain since she slipped and appropriately on a couch greater than 4 months ago. Patient states that it is more of a dull, throbbing aching pain. Denies any radiation or any weakness. States that it seems to start usually on the anterior part of her shoulder and radiate towards her neck. States that certain motions to make it worse. Patient states that it usually is only at night and when she wakes up in the morning. Patient states during the day it seems to get better and then once again at night it seems to get worse again. Rates the severity of pain a 7 out of 10. Has minimally responded to over-the-counter anti-inflammatory's.  Past Medical History  Diagnosis Date  . Hyperlipidemia   . Hypertension   . Chicken pox middle school  . IBS (irritable bowel syndrome) 2002  . Enuresis     childhood, resolved  . Allergy     PCN, sulfa, Lidocaine cream and morphine  . History of cervical cancer 12/15/2013  . Lateral epicondylitis of right elbow 09/04/2014  . Constipation 03/19/2015   Past Surgical History  Procedure Laterality Date  . Radical hysterectomy    . Breast reduction surgery  2008  . Abdominal hysterectomy     History  Substance Use Topics  . Smoking status: Never Smoker   . Smokeless tobacco: Never Used  . Alcohol Use: 4.2 oz/week    7 Glasses of wine per week     Comment: socially -- glass of wine a ngiht.   Allergies  Allergen Reactions  . Morphine And Related   . Other Swelling    Numbing Creams  . Penicillins   . Sulfa Antibiotics   . Tape    Family History  Problem Relation Age of Onset  . Cancer Mother 15    breast cancer  .  Fibromyalgia Mother   . Hypertension Father   . Heart disease Father   . Hypertension Paternal Uncle   . Gout Brother   . Cancer Maternal Grandmother 20    breast  . Diabetes Maternal Grandfather     type 2  . Heart disease Paternal Grandmother   . Other Paternal Grandfather     brain tumor  . Gout Sister         Past medical history, social, surgical and family history all reviewed in electronic medical record.   Review of Systems: No headache, visual changes, nausea, vomiting, diarrhea, constipation, dizziness, abdominal pain, skin rash, fevers, chills, night sweats, weight loss, swollen lymph nodes, body aches, joint swelling, muscle aches, chest pain, shortness of breath, mood changes.   Objective Blood pressure 130/82, pulse 65, height 5\' 3"  (1.6 m), weight 125 lb (56.7 kg), SpO2 99 %.  General: No apparent distress alert and oriented x3 mood and affect normal, dressed appropriately.  HEENT: Pupils equal, extraocular movements intact  Respiratory: Patient's speak in full sentences and does not appear short of breath  Cardiovascular: No lower extremity edema, non tender, no erythema  Skin: Warm dry intact with no signs of infection or rash on extremities or on axial skeleton.  Abdomen: Soft nontender  Neuro: Cranial nerves  II through XII are intact, neurovascularly intact in all extremities with 2+ DTRs and 2+ pulses.  Lymph: No lymphadenopathy of posterior or anterior cervical chain or axillae bilaterally.  Gait normal with good balance and coordination.  MSK:  Non tender with full range of motion and good stability and symmetric strength and tone of , elbows, wrist, hip, knee and ankles bilaterally.  Neck: Inspection unremarkable. No palpable stepoffs. Negative Spurling's maneuver. Full neck range of motion Grip strength and sensation normal in bilateral hands Strength good C4 to T1 distribution No sensory change to C4 to T1 Negative Hoffman sign bilaterally Reflexes  normal Shoulder: Right Inspection reveals no abnormalities, atrophy or asymmetry. Palpation is normal with no tenderness over AC joint or bicipital groove. ROM is full in all planes passively. Rotator cuff strength normal throughout. signs of impingement with positive Neer and Hawkin's tests, but negative empty can sign. Speeds and Yergason's tests normal. No labral pathology noted with negative Obrien's, negative clunk and good stability. Normal scapular function observed. No painful arc and no drop arm sign. No apprehension sign  MSK US performed of: Right This study was ordered, performed, and interpreted by Charlann Boxer D.O.  Shoulder:   Supraspinatus:  Appears normal on long and transverse views, Bursal bulge seen with shoulder abduction on impingement view. Infraspinatus:  Appears normal on long and transverse views. Significant increase in Doppler flow Subscapularis:  Appears normal on long and transverse views. Positive bursa with significant calcific changes noted. Teres Minor:  Appears normal on long and transverse views. AC joint:  Capsule undistended, no geyser sign. Glenohumeral Joint:  Appears normal without effusion. Glenoid Labrum:  Intact without visualized tears. Biceps Tendon:  Appears normal on long and transverse views, no fraying of tendon, tendon located in intertubercular groove, no subluxation with shoulder internal or external rotation.  Impression: Calcific Subacromial bursitis anterior under subscapularis   Osteopathic findings C2 flexed rotated and side bent left T1 flexed rotated and side bent right with elevated first rib T5 extended rotated and side bent left   Impression and Recommendations:     This case required medical decision making of moderate complexity.

## 2015-04-05 NOTE — Assessment & Plan Note (Addendum)
  Limited first rib on the right. I do think that this is secondary to more of her sleeping position. We discussed proper sleep mechanics that I think will be beneficial. We discussed icing regimen, we discussed exercises that could be helpful as well. Patient did work with Product/process development scientist today as well. There is a chance the patient does have a calcific bursitis of the right shoulder that could also be contributing. Patient has some mild radicular symptoms with compression of Spurling's we will get x-rays to further evaluate that as well. Patient and will come back again in 3 weeks and if she is doing much better we will continue with osteopathic manipulation regularly.

## 2015-04-26 ENCOUNTER — Ambulatory Visit: Payer: BLUE CROSS/BLUE SHIELD | Admitting: Family Medicine

## 2015-05-02 ENCOUNTER — Telehealth: Payer: Self-pay | Admitting: Family Medicine

## 2015-05-02 MED ORDER — METHYLPHENIDATE HCL ER (LA) 20 MG PO CP24: 20.0000 mg | ORAL_CAPSULE | ORAL | Status: DC

## 2015-05-02 NOTE — Telephone Encounter (Signed)
Printed prescription and updated medication list.  Called the patient to pickup hardcopy and instructed on PCP's instructions.  The patient agreed to all.

## 2015-05-02 NOTE — Telephone Encounter (Signed)
Warn her if we switch she has to come in in 3 weeks to check vitals, switch to Ritalin XR 20 mg tas 1 tab po daily disp #30

## 2015-05-02 NOTE — Telephone Encounter (Signed)
Caller name: Elexa Relation to pt: self Call back number: [phone] Pharmacy:  Reason for call:   Patient is requesting an rx that is cheaper than vyvanse. She states that she hasn't met her deductible and is requesting something cheaper

## 2015-05-04 ENCOUNTER — Other Ambulatory Visit: Payer: Self-pay | Admitting: Obstetrics and Gynecology

## 2015-05-04 DIAGNOSIS — Z803 Family history of malignant neoplasm of breast: Secondary | ICD-10-CM

## 2015-05-08 ENCOUNTER — Telehealth: Payer: Self-pay | Admitting: *Deleted

## 2015-05-08 NOTE — Telephone Encounter (Signed)
Ok to switch to Ritalin IR 20 mg tabs, 1 tab po qd prn. Disp #30 and make sure she has an appt before she runs out of med

## 2015-05-08 NOTE — Telephone Encounter (Signed)
Spoke to [facility] and patient is requesting Ritalin 20mg  IR instead of ER  Please advise.

## 2015-05-09 MED ORDER — METHYLPHENIDATE HCL 20 MG PO TABS: 20.0000 mg | ORAL_TABLET | Freq: Every day | ORAL | Status: DC

## 2015-05-09 NOTE — Telephone Encounter (Signed)
Printed new prescription and on counter for signature.

## 2015-05-09 NOTE — Telephone Encounter (Signed)
Called the patient left a message to pickup hardcopy of Ritalin at our front desk.

## 2015-05-23 ENCOUNTER — Ambulatory Visit (INDEPENDENT_AMBULATORY_CARE_PROVIDER_SITE_OTHER): Payer: BLUE CROSS/BLUE SHIELD

## 2015-05-23 ENCOUNTER — Other Ambulatory Visit: Payer: Self-pay

## 2015-05-23 VITALS — BP 127/82 | HR 74 | Temp 98.1°F | Wt 126.8 lb

## 2015-05-23 DIAGNOSIS — F900 Attention-deficit hyperactivity disorder, predominantly inattentive type: Secondary | ICD-10-CM | POA: Diagnosis not present

## 2015-05-23 DIAGNOSIS — F909 Attention-deficit hyperactivity disorder, unspecified type: Secondary | ICD-10-CM

## 2015-05-23 MED ORDER — METHYLPHENIDATE HCL 20 MG PO TABS: 20.0000 mg | ORAL_TABLET | Freq: Every day | ORAL | Status: DC

## 2015-05-23 NOTE — Telephone Encounter (Signed)
So one of the problems is she does not have another appt. I am willing to let her try adding a second dose of Ritalin or switch her the ER version for the next month as long as I see her before it runs out.

## 2015-05-23 NOTE — Telephone Encounter (Signed)
I have refilled this for now but need to see her for a visit in the next several months as well to discuss response, side effects etc

## 2015-05-23 NOTE — Progress Notes (Signed)
Pre visit review using our clinic review tool, if applicable. No additional management support is needed unless otherwise documented below in the visit note. 

## 2015-05-23 NOTE — Telephone Encounter (Signed)
Called the patient to pickup hardcopy.  She did state the ritalin 20 mg is not doing much for her and wanted to have an increase, but if PCP does not approve will just keep the 20 mg for now and discuss at her next OV.

## 2015-05-24 NOTE — Telephone Encounter (Signed)
Left a message for call back.  When patient calls back, she needs to schedule a follow up visit with Dr. Charlett Blake regarding medication below.  It is also time for patient to have an annual exam. Please schedule.    Per instructions from office note on 03/09/15, pt was advised to do the following---"Call to have lab work ordered the week before your annual exam, lipid, cmp, cbc, tsh, hyperlipidemia mixed, HTN, ess."

## 2015-05-25 ENCOUNTER — Ambulatory Visit: Payer: BLUE CROSS/BLUE SHIELD

## 2015-05-25 NOTE — Telephone Encounter (Signed)
Called the patient did inform of all PCP instructions.  The patient at this time is having to pay out of pocket for everything and can not afford an appointment at this time.  She will stay for now on the same dose she is taking and can just discuss at another [place].  Put the hardcopy at the front for the patient to pickup.

## 2015-07-24 ENCOUNTER — Other Ambulatory Visit: Payer: Self-pay | Admitting: Family Medicine

## 2015-07-24 MED ORDER — TRAZODONE HCL 50 MG PO TABS: ORAL_TABLET | ORAL | Status: DC

## 2015-07-24 NOTE — Telephone Encounter (Signed)
Requesting:Trazodone Contract None UDS None Last OV  03/09/15 Last Refill  #30 with 2 refills 03/09/15  Please Advise

## 2015-09-13 ENCOUNTER — Telehealth: Payer: Self-pay | Admitting: Family Medicine

## 2015-09-13 NOTE — Telephone Encounter (Signed)
LM to sched flu shot or call if already received, also asked pt to call and reschedule 01/25/15 CPE

## 2015-09-21 ENCOUNTER — Encounter: Payer: Self-pay | Admitting: Family Medicine

## 2015-09-21 ENCOUNTER — Other Ambulatory Visit (INDEPENDENT_AMBULATORY_CARE_PROVIDER_SITE_OTHER): Payer: BLUE CROSS/BLUE SHIELD

## 2015-09-21 ENCOUNTER — Ambulatory Visit (INDEPENDENT_AMBULATORY_CARE_PROVIDER_SITE_OTHER): Payer: BLUE CROSS/BLUE SHIELD | Admitting: Family Medicine

## 2015-09-21 VITALS — BP 108/78 | HR 82 | Ht 63.0 in | Wt 126.0 lb

## 2015-09-21 DIAGNOSIS — M6520 Calcific tendinitis, unspecified site: Secondary | ICD-10-CM

## 2015-09-21 DIAGNOSIS — M999 Biomechanical lesion, unspecified: Secondary | ICD-10-CM

## 2015-09-21 DIAGNOSIS — M9902 Segmental and somatic dysfunction of thoracic region: Secondary | ICD-10-CM | POA: Diagnosis not present

## 2015-09-21 DIAGNOSIS — M5030 Other cervical disc degeneration, unspecified cervical region: Secondary | ICD-10-CM | POA: Diagnosis not present

## 2015-09-21 DIAGNOSIS — M7530 Calcific tendinitis of unspecified shoulder: Secondary | ICD-10-CM

## 2015-09-21 MED ORDER — TIZANIDINE HCL 4 MG PO TABS: 4.0000 mg | ORAL_TABLET | Freq: Every evening | ORAL | Status: DC

## 2015-09-21 NOTE — Progress Notes (Signed)
[facility] [address] [place], [place] [address] Phone: [phone] Subjective:    I'm seeing this patient by the request  of:  BLYTH, Erline Levine, MD   CC: right shoulder pain and neck pain follow-up  [id number] Melody Wilcox is a 49 y.o. female coming in with complaint of right shoulder pain. Patient was found to have calcific bursitis in her shoulder and elected try conservative therapy. Patient also try manipulation. Did feel better for approximately 24 hours and then the pain returned. Continued to have difficulty. We did get x-rays the patient's shoulder and neck. X-rays were reviewed by me. Patient's shoulder was unremarkable but patient's neck had moderate degenerative disc disease at multiple levels. Patient states that she still has some mild radiation on the anterior aspect chest. Denies any fever, chills, any abnormal weight loss. Does not know if it seems to be associated with any type of eating or activity. States that reaching across her body is still somewhat painful. States though that the pain seems to radiate up toward neck than truly down her arm. Denies any symptoms such as reflex.  Past Medical History  Diagnosis Date  . Hyperlipidemia   . Hypertension   . Chicken pox middle school  . IBS (irritable bowel syndrome) 2002  . Enuresis     childhood, resolved  . Allergy     PCN, sulfa, Lidocaine cream and morphine  . History of cervical cancer 12/15/2013  . Lateral epicondylitis of right elbow 09/04/2014  . Constipation 03/19/2015   Past Surgical History  Procedure Laterality Date  . Radical hysterectomy    . Breast reduction surgery  2008  . Abdominal hysterectomy     Social History  Substance Use Topics  . Smoking status: Never Smoker   . Smokeless tobacco: Never Used  . Alcohol Use: 4.2 oz/week    7 Glasses of wine per week     Comment: socially -- glass of wine a ngiht.   Allergies  Allergen Reactions  . Morphine And Related    . Other Swelling    Numbing Creams  . Penicillins   . Sulfa Antibiotics   . Tape    Family History  Problem Relation Age of Onset  . Cancer Mother 66    breast cancer  . Fibromyalgia Mother   . Hypertension Father   . Heart disease Father   . Hypertension Paternal Uncle   . Gout Brother   . Cancer Maternal Grandmother 80    breast  . Diabetes Maternal Grandfather     type 2  . Heart disease Paternal Grandmother   . Other Paternal Grandfather     brain tumor  . Gout Sister         Past medical history, social, surgical and family history all reviewed in electronic medical record.   Review of Systems: No headache, visual changes, nausea, vomiting, diarrhea, constipation, dizziness, abdominal pain, skin rash, fevers, chills, night sweats, weight loss, swollen lymph nodes, body aches, joint swelling, muscle aches, chest pain, shortness of breath, mood changes.   Objective Blood pressure 108/78, pulse 82, height 5\' 3"  (1.6 m), weight 126 lb (57.153 kg), SpO2 97 %.  General: No apparent distress alert and oriented x3 mood and affect normal, dressed appropriately.  HEENT: Pupils equal, extraocular movements intact  Respiratory: Patient's speak in full sentences and does not appear short of breath  Cardiovascular: No lower extremity edema, non tender, no erythema  Skin: Warm dry intact with no  signs of infection or rash on extremities or on axial skeleton.  Abdomen: Soft nontender  Neuro: Cranial nerves II through XII are intact, neurovascularly intact in all extremities with 2+ DTRs and 2+ pulses.  Lymph: No lymphadenopathy of posterior or anterior cervical chain or axillae bilaterally.  Gait normal with good balance and coordination.  MSK:  Non tender with full range of motion and good stability and symmetric strength and tone of , elbows, wrist, hip, knee and ankles bilaterally.  Neck: Inspection unremarkable. No palpable stepoffs. Positive Spurling's on the right side  with mild radiculopathy to the shoulder. Full neck range of motion Grip strength and sensation normal in bilateral hands Strength good C4 to T1 distribution No sensory change to C4 to T1 Negative Hoffman sign bilaterally Reflexes normal Shoulder: Right Inspection reveals no abnormalities, atrophy or asymmetry. Palpation is normal with no tenderness over AC joint or bicipital groove. ROM is full in all planes passively. Rotator cuff strength normal throughout. signs of impingement with positive Neer and Hawkin's tests, but negative empty can sign. Speeds and Yergason's tests normal. No labral pathology noted with negative Obrien's, negative clunk and good stability. Normal scapular function observed. No painful arc and no drop arm sign. No apprehension sign  no change from previous exam.    Procedure: Real-time Ultrasound Guided Injection of right glenohumeral joint Device: GE Logiq E  Ultrasound guided injection is preferred based studies that show increased duration, increased effect, greater accuracy, decreased procedural pain, increased response rate with ultrasound guided versus blind injection.  Verbal informed consent obtained.  Time-out conducted.  Noted no overlying erythema, induration, or other signs of local infection.  Skin prepped in a sterile fashion.  Local anesthesia: Topical Ethyl chloride.  With sterile technique and under real time ultrasound guidance:  Joint visualized.  23g 1  inch needle inserted posterior approach. Pictures taken for needle placement. Patient did have injection of 2 cc of 1% lidocaine, 2 cc of 0.5% Marcaine, and 1.0 cc of Kenalog 40 mg/dL. Completed without difficulty  Pain immediately resolved suggesting accurate placement of the medication.  Advised to call if fevers/chills, erythema, induration, drainage, or persistent bleeding.  Images permanently stored and available for review in the ultrasound unit.  Impression: Technically successful  ultrasound guided injection.    Osteopathic findings C2 flexed rotated and side bent left T1 flexed rotated and side bent right with elevated first rib T5 extended rotated and side bent left L2 flexed rotated and side bent right Sacrum left on left   Impression and Recommendations:     This case required medical decision making of moderate complexity.

## 2015-09-21 NOTE — Patient Instructions (Signed)
Continue what you are doing Try the exercises again on Monday and do them 3 times a week If pain worsens call me Monday and I want MRI of neck.  Ice is your friend Zanaflex at night See me again in 3 weeks.

## 2015-09-21 NOTE — Progress Notes (Signed)
Pre visit review using our clinic review tool, if applicable. No additional management support is needed unless otherwise documented below in the visit note. 

## 2015-09-21 NOTE — Assessment & Plan Note (Signed)
Patient given an injection today and tolerated the procedure well. Moderate decrease in pain overall. I do feel that there is some cervical radiculopathy that is also contribute in. We discussed the possibility of further imaging of the neck if this does not seem to improve. Patient declined formal physical therapy. Attempted osteopathic manipulation again with moderate improvement. We'll hope that patient continues to improve and encourage her to continue with the natural supplementations. Patient given a muscle relaxer to have on hand as well. Return to clinic in 3 weeks.

## 2015-09-21 NOTE — Assessment & Plan Note (Signed)
Decision today to treat with OMT was based on Physical Exam  After verbal consent patient was treated with HVLA, ME techniques in cervical, thoracic rib and lumbar areas  Patient tolerated the procedure well with improvement in symptoms  Patient given exercises, stretches and lifestyle modifications  See medications in patient instructions if given  Patient will follow up in 3 weeks

## 2015-10-03 ENCOUNTER — Other Ambulatory Visit: Payer: Self-pay | Admitting: Family Medicine

## 2015-10-03 ENCOUNTER — Telehealth: Payer: Self-pay | Admitting: Family Medicine

## 2015-10-03 DIAGNOSIS — R109 Unspecified abdominal pain: Secondary | ICD-10-CM

## 2015-10-03 NOTE — Telephone Encounter (Signed)
Caller name: Braniyah   Relationship to patient: Self   Can be reached: [phone]  Reason for call: pt says that she has been having abdominal pain for 5 years. She says that she has seen PCP and also [facility] for this issue. She would like to see PCP about concern but states that her appt keeps having to be rescheduled. Pt  would like to know if provider could just put in a referral to GI.  Please advise further.     Thanks.

## 2015-10-03 NOTE — Telephone Encounter (Signed)
I have ordered a referral to gastroenterology, please let her know

## 2015-10-04 NOTE — Telephone Encounter (Signed)
Called left a detailed message referral done.

## 2015-10-13 ENCOUNTER — Ambulatory Visit: Payer: BLUE CROSS/BLUE SHIELD | Admitting: Family Medicine

## 2015-11-27 ENCOUNTER — Other Ambulatory Visit: Payer: Self-pay | Admitting: Family Medicine

## 2015-11-27 MED ORDER — TRAZODONE HCL 50 MG PO TABS: ORAL_TABLET | ORAL | Status: DC

## 2015-11-27 NOTE — Telephone Encounter (Signed)
Requesting: Trazodone Contract none UDS  none Last OV  03/09/2015 Last Refill  #30 with 2 refills on 07/24/2015  Please Advise

## 2015-12-16 ENCOUNTER — Emergency Department (HOSPITAL_COMMUNITY)
Admission: EM | Admit: 2015-12-16 | Discharge: 2015-12-16 | Disposition: A | Discharge status: Home / Self Care | Payer: BLUE CROSS/BLUE SHIELD | Attending: Emergency Medicine | Admitting: Emergency Medicine

## 2015-12-16 ENCOUNTER — Encounter (HOSPITAL_COMMUNITY): Payer: Self-pay | Admitting: *Deleted

## 2015-12-16 DIAGNOSIS — R109 Unspecified abdominal pain: Secondary | ICD-10-CM | POA: Diagnosis not present

## 2015-12-16 DIAGNOSIS — Z880 Allergy status to penicillin: Secondary | ICD-10-CM | POA: Diagnosis not present

## 2015-12-16 DIAGNOSIS — I10 Essential (primary) hypertension: Secondary | ICD-10-CM | POA: Diagnosis not present

## 2015-12-16 DIAGNOSIS — R1110 Vomiting, unspecified: Secondary | ICD-10-CM | POA: Insufficient documentation

## 2015-12-16 DIAGNOSIS — R197 Diarrhea, unspecified: Secondary | ICD-10-CM | POA: Insufficient documentation

## 2015-12-16 DIAGNOSIS — Z8619 Personal history of other infectious and parasitic diseases: Secondary | ICD-10-CM | POA: Insufficient documentation

## 2015-12-16 DIAGNOSIS — Z8639 Personal history of other endocrine, nutritional and metabolic disease: Secondary | ICD-10-CM | POA: Insufficient documentation

## 2015-12-16 DIAGNOSIS — Z8739 Personal history of other diseases of the musculoskeletal system and connective tissue: Secondary | ICD-10-CM | POA: Insufficient documentation

## 2015-12-16 DIAGNOSIS — Z79899 Other long term (current) drug therapy: Secondary | ICD-10-CM | POA: Insufficient documentation

## 2015-12-16 DIAGNOSIS — Z8541 Personal history of malignant neoplasm of cervix uteri: Secondary | ICD-10-CM | POA: Diagnosis not present

## 2015-12-16 DIAGNOSIS — Z8719 Personal history of other diseases of the digestive system: Secondary | ICD-10-CM | POA: Diagnosis not present

## 2015-12-16 LAB — COMPREHENSIVE METABOLIC PANEL
ALT: 19 U/L (ref 14–54)
ANION GAP: 14 (ref 5–15)
AST: 24 U/L (ref 15–41)
Albumin: 4 g/dL (ref 3.5–5.0)
Alkaline Phosphatase: 44 U/L (ref 38–126)
BUN: 21 mg/dL — ABNORMAL HIGH (ref 6–20)
CHLORIDE: 108 mmol/L (ref 101–111)
CO2: 20 mmol/L — AB (ref 22–32)
CREATININE: 0.75 mg/dL (ref 0.44–1.00)
Calcium: 9.4 mg/dL (ref 8.9–10.3)
Glucose, Bld: 131 mg/dL — ABNORMAL HIGH (ref 65–99)
POTASSIUM: 4.3 mmol/L (ref 3.5–5.1)
SODIUM: 142 mmol/L (ref 135–145)
Total Bilirubin: 0.6 mg/dL (ref 0.3–1.2)
Total Protein: 6.7 g/dL (ref 6.5–8.1)

## 2015-12-16 LAB — CBC
HCT: 48.5 % — ABNORMAL HIGH (ref 36.0–46.0)
HEMOGLOBIN: 16.1 g/dL — AB (ref 12.0–15.0)
MCH: 30.7 pg (ref 26.0–34.0)
MCHC: 33.2 g/dL (ref 30.0–36.0)
MCV: 92.6 fL (ref 78.0–100.0)
PLATELETS: 308 10*3/uL (ref 150–400)
RBC: 5.24 MIL/uL — AB (ref 3.87–5.11)
RDW: 14 % (ref 11.5–15.5)
WBC: 8.9 10*3/uL (ref 4.0–10.5)

## 2015-12-16 LAB — LIPASE, BLOOD: LIPASE: 26 U/L (ref 11–51)

## 2015-12-16 MED ORDER — ONDANSETRON HCL 4 MG PO TABS: 4.0000 mg | ORAL_TABLET | Freq: Three times a day (TID) | ORAL | Status: DC | PRN

## 2015-12-16 MED ORDER — LOPERAMIDE HCL 2 MG PO TABS: 2.0000 mg | ORAL_TABLET | Freq: Four times a day (QID) | ORAL | Status: DC | PRN

## 2015-12-16 MED ORDER — LOPERAMIDE HCL 2 MG PO CAPS
2.0000 mg | ORAL_CAPSULE | Freq: Once | ORAL | Status: AC
  Administered 2015-12-16: 2 mg via ORAL
  Filled 2015-12-16: qty 1

## 2015-12-16 MED ORDER — ONDANSETRON HCL 4 MG/2ML IJ SOLN
4.0000 mg | Freq: Four times a day (QID) | INTRAMUSCULAR | Status: DC
  Administered 2015-12-16: 4 mg via INTRAVENOUS
  Filled 2015-12-16: qty 2

## 2015-12-16 MED ORDER — SODIUM CHLORIDE 0.9 % IV BOLUS (SEPSIS)
1000.0000 mL | Freq: Once | INTRAVENOUS | Status: AC
  Administered 2015-12-16: 1000 mL via INTRAVENOUS

## 2015-12-16 NOTE — ED Notes (Signed)
MD at bedside. 

## 2015-12-16 NOTE — ED Notes (Signed)
Resident at bedside.  

## 2015-12-16 NOTE — ED Notes (Signed)
The pt has had nv and diatthea for 2 hours.  She ate a pizza around 2000 and she thinks she has food poisoning

## 2015-12-16 NOTE — Discharge Instructions (Signed)
You were diagnosed with food poisoning Please take zofran for nausea and imodium for diarrhea Please stay hydrated with water If you are unable to keep water down, have worsening vomiting or diarrhea return to the ED for evaluation

## 2015-12-16 NOTE — ED Provider Notes (Signed)
CSN: [id number]     Arrival date & time 12/16/15  0221 History   First MD Initiated Contact with Patient 12/16/15 0425     Chief Complaint  Patient presents with  . Abdominal Pain   HPI   50 y/o presetning for abdominal pain. She was in her usual state of health until this AM at approx 2 AM  with watery diarrhea, nausea, and vomitting. Denies blood in emesis or diarrhea, no black colored stool. Of note she ate at [employer] yesterday evening around 8 pm and ahd " bad tasting" arugula. She was the only one to eat this. No one else who ate with her became sick after this meal. She denies sick contacts.  She vomited approx 15 times with several watery stools since waking. She presented to the ED because she was worried she was dehydrated and had severe abdominal pain Otherwise denies SOB, chest pain, headache, vision changes, new weakness.  Past Medical History  Diagnosis Date  . Hyperlipidemia   . Hypertension   . Chicken pox middle school  . IBS (irritable bowel syndrome) 2002  . Enuresis     childhood, resolved  . Allergy     PCN, sulfa, Lidocaine cream and morphine  . History of cervical cancer 12/15/2013  . Lateral epicondylitis of right elbow 09/04/2014  . Constipation 03/19/2015   Past Surgical History  Procedure Laterality Date  . Radical hysterectomy    . Breast reduction surgery  2008  . Abdominal hysterectomy     Family History  Problem Relation Age of Onset  . Cancer Mother 33    breast cancer  . Fibromyalgia Mother   . Hypertension Father   . Heart disease Father   . Hypertension Paternal Uncle   . Gout Brother   . Cancer Maternal Grandmother 21    breast  . Diabetes Maternal Grandfather     type 2  . Heart disease Paternal Grandmother   . Other Paternal Grandfather     brain tumor  . Gout Sister    Social History  Substance Use Topics  . Smoking status: Never Smoker   . Smokeless tobacco: Never Used  . Alcohol Use: 4.2 oz/week    7 Glasses of wine per  week     Comment: socially -- glass of wine a ngiht.   OB History    No data available     Review of Systems  Per HPI  Allergies  Morphine and related; Other; Penicillins; Sulfa antibiotics; and Tape  Home Medications   Prior to Admission medications   Medication Sig Start Date End Date Taking? Authorizing Provider  fluticasone (FLONASE) 50 MCG/ACT nasal spray Place 2 sprays into both nostrils daily as needed for allergies or rhinitis.    Historical Provider, MD  losartan (COZAAR) 25 MG tablet Take 1 tablet (25 mg total) by mouth daily. HTN 03/09/15   Mosie Lukes, MD  tiZANidine (ZANAFLEX) 4 MG tablet Take 1 tablet (4 mg total) by mouth Nightly. 09/21/15   Lyndal Pulley, DO  traZODone (DESYREL) 50 MG tablet 1 tab po qhs 11/27/15   Mosie Lukes, MD   BP 129/85 mmHg  Pulse 96  Temp(Src) 97.5 F (36.4 C) (Oral)  Resp 21  SpO2 95% Physical Exam  Constitutional: She is oriented to person, place, and time. She appears well-developed and well-nourished. She appears distressed.  HENT:  Head: Normocephalic.  Eyes: EOM are normal. Pupils are equal, round, and reactive to light.  Cardiovascular: Normal rate, regular rhythm and normal heart sounds.   Pulmonary/Chest: Effort normal and breath sounds normal. No respiratory distress.  Abdominal: Soft. She exhibits no distension. There is tenderness. There is no rebound.  Neurological: She is alert and oriented to person, place, and time.  Skin: Skin is warm and dry.     ED Course  Procedures (including critical care time) Labs Review Labs Reviewed  COMPREHENSIVE METABOLIC PANEL - Abnormal; Notable for the following:    CO2 20 (*)    Glucose, Bld 131 (*)    BUN 21 (*)    All other components within normal limits  CBC - Abnormal; Notable for the following:    RBC 5.24 (*)    Hemoglobin 16.1 (*)    HCT 48.5 (*)    All other components within normal limits  LIPASE, BLOOD  URINALYSIS, ROUTINE W REFLEX MICROSCOPIC (NOT AT [facility])       MDM   50 y/o F presenting for emesis, diarrhea and abdominal pain after eating food that tasted as if it were spoiled, yesterday evening Given last of fever, normal WBC and otherwise normal labs symptoms are unlikely to be due to an infectious cause. Normal Lipase makes pancreatic etiology unlikeley. Electrolytes are largely within normal limits,  After rehydration with IV fluid bolus, zofran for nausea and imodium for diarrhea her symptoms improved and she was abe to tolerate PO. She was discharged with zofran and imodium to follow up with her PCP     Alyssa A. Lincoln Brigham MD, [facility] Resident PGY-2 Pager [phone]     Veatrice Bourbon, MD 12/16/15 [id number]  Veatrice Bourbon, MD 12/16/15 [place], MD 12/16/15 [phone]

## 2015-12-18 ENCOUNTER — Telehealth: Payer: Self-pay | Admitting: Family Medicine

## 2015-12-18 NOTE — Telephone Encounter (Signed)
Documented in health maintenance and immunizations.

## 2015-12-18 NOTE — Telephone Encounter (Signed)
Pt had flu shot 06/2015 at her employer [employer])

## 2015-12-22 ENCOUNTER — Telehealth: Payer: Self-pay | Admitting: Family Medicine

## 2015-12-22 ENCOUNTER — Ambulatory Visit: Payer: BLUE CROSS/BLUE SHIELD | Admitting: Family Medicine

## 2015-12-25 NOTE — Telephone Encounter (Signed)
Pt was no show 12/21/13 11:15am appt, 1st no show I see, pt has not rescheduled, charge or no charge?

## 2015-12-25 NOTE — Telephone Encounter (Signed)
No charge. 

## 2015-12-26 ENCOUNTER — Other Ambulatory Visit: Payer: Self-pay | Admitting: Physician Assistant

## 2015-12-26 NOTE — Telephone Encounter (Signed)
Will defer further refills of patient's medications to PCP  

## 2016-01-02 ENCOUNTER — Ambulatory Visit (INDEPENDENT_AMBULATORY_CARE_PROVIDER_SITE_OTHER): Payer: BLUE CROSS/BLUE SHIELD | Admitting: Family Medicine

## 2016-01-02 ENCOUNTER — Other Ambulatory Visit: Payer: Self-pay | Admitting: Family Medicine

## 2016-01-02 ENCOUNTER — Encounter: Payer: Self-pay | Admitting: Family Medicine

## 2016-01-02 VITALS — BP 120/76 | HR 63 | Temp 97.8°F | Ht 63.0 in | Wt 125.1 lb

## 2016-01-02 DIAGNOSIS — R1084 Generalized abdominal pain: Secondary | ICD-10-CM

## 2016-01-02 DIAGNOSIS — R112 Nausea with vomiting, unspecified: Secondary | ICD-10-CM

## 2016-01-02 DIAGNOSIS — E785 Hyperlipidemia, unspecified: Secondary | ICD-10-CM

## 2016-01-02 DIAGNOSIS — R1013 Epigastric pain: Secondary | ICD-10-CM | POA: Diagnosis not present

## 2016-01-02 DIAGNOSIS — R140 Abdominal distension (gaseous): Secondary | ICD-10-CM | POA: Diagnosis not present

## 2016-01-02 DIAGNOSIS — I10 Essential (primary) hypertension: Secondary | ICD-10-CM | POA: Diagnosis not present

## 2016-01-02 LAB — COMPREHENSIVE METABOLIC PANEL
ALT: 20 U/L (ref 0–35)
AST: 23 U/L (ref 0–37)
Albumin: 4.4 g/dL (ref 3.5–5.2)
Alkaline Phosphatase: 53 U/L (ref 39–117)
BILIRUBIN TOTAL: 0.6 mg/dL (ref 0.2–1.2)
BUN: 17 mg/dL (ref 6–23)
CHLORIDE: 103 meq/L (ref 96–112)
CO2: 30 mEq/L (ref 19–32)
CREATININE: 0.7 mg/dL (ref 0.40–1.20)
Calcium: 9.7 mg/dL (ref 8.4–10.5)
GFR: 93.39 mL/min (ref >60.00)
Glucose, Bld: 107 mg/dL — ABNORMAL HIGH (ref 70–99)
Potassium: 4.5 mEq/L (ref 3.5–5.1)
SODIUM: 139 meq/L (ref 135–145)
TOTAL PROTEIN: 7.1 g/dL (ref 6.0–8.3)

## 2016-01-02 LAB — LIPID PANEL
CHOLESTEROL: 235 mg/dL — AB (ref 0–200)
HDL: 81 mg/dL (ref >39.00)
LDL CALC: 135 mg/dL — AB (ref 0–99)
NonHDL: 154.39
Total CHOL/HDL Ratio: 3
Triglycerides: 96 mg/dL (ref 0.0–149.0)
VLDL: 19.2 mg/dL (ref 0.0–40.0)

## 2016-01-02 LAB — CBC
HCT: 44.6 % (ref 36.0–46.0)
Hemoglobin: 15 g/dL (ref 12.0–15.0)
MCHC: 33.6 g/dL (ref 30.0–36.0)
MCV: 91.1 fl (ref 78.0–100.0)
PLATELETS: 415 10*3/uL — AB (ref 150.0–400.0)
RBC: 4.9 Mil/uL (ref 3.87–5.11)
RDW: 13.7 % (ref 11.5–15.5)
WBC: 4.8 10*3/uL (ref 4.0–10.5)

## 2016-01-02 LAB — H. PYLORI ANTIBODY, IGG: H PYLORI IGG: NEGATIVE

## 2016-01-02 LAB — LIPASE: LIPASE: 79 U/L — AB (ref 11.0–59.0)

## 2016-01-02 LAB — TSH: TSH: 1.72 u[IU]/mL (ref 0.35–4.50)

## 2016-01-02 LAB — AMYLASE: Amylase: 55 U/L (ref 27–131)

## 2016-01-02 NOTE — Patient Instructions (Addendum)
NOW Probiotic Vitamin. Available at [employer].  Gastroesophageal Reflux Disease, Adult Normally, food travels down the esophagus and stays in the stomach to be digested. However, when a person has gastroesophageal reflux disease (GERD), food and stomach acid move back up into the esophagus. When this happens, the esophagus becomes sore and inflamed. Over time, GERD can create small holes (ulcers) in the lining of the esophagus.  CAUSES This condition is caused by a problem with the muscle between the esophagus and the stomach (lower esophageal sphincter, or LES). Normally, the LES muscle closes after food passes through the esophagus to the stomach. When the LES is weakened or abnormal, it does not close properly, and that allows food and stomach acid to go back up into the esophagus. The LES can be weakened by certain dietary substances, medicines, and medical conditions, including:  Tobacco use.  Pregnancy.  Having a hiatal hernia.  Heavy alcohol use.  Certain foods and beverages, such as coffee, chocolate, onions, and peppermint. RISK FACTORS This condition is more likely to develop in:  People who have an increased body weight.  People who have connective tissue disorders.  People who use NSAID medicines. SYMPTOMS Symptoms of this condition include:  Heartburn.  Difficult or painful swallowing.  The feeling of having a lump in the throat.  Abitter taste in the mouth.  Bad breath.  Having a large amount of saliva.  Having an upset or bloated stomach.  Belching.  Chest pain.  Shortness of breath or wheezing.  Ongoing (chronic) cough or a night-time cough.  Wearing away of tooth enamel.  Weight loss. Different conditions can cause chest pain. Make sure to see your health care provider if you experience chest pain. DIAGNOSIS Your health care provider will take a medical history and perform a physical exam. To determine if you have mild or severe GERD,  your health care provider may also monitor how you respond to treatment. You may also have other tests, including:  An endoscopy toexamine your stomach and esophagus with a small camera.  A test thatmeasures the acidity level in your esophagus.  A test thatmeasures how much pressure is on your esophagus.  A barium swallow or modified barium swallow to show the shape, size, and functioning of your esophagus. TREATMENT The goal of treatment is to help relieve your symptoms and to prevent complications. Treatment for this condition may vary depending on how severe your symptoms are. Your health care provider may recommend:  Changes to your diet.  Medicine.  Surgery. HOME CARE INSTRUCTIONS Diet  Follow a diet as recommended by your health care provider. This may involve avoiding foods and drinks such as:  Coffee and tea (with or without caffeine).  Drinks that containalcohol.  Energy drinks and sports drinks.  Carbonated drinks or sodas.  Chocolate and cocoa.  Peppermint and mint flavorings.  Garlic and onions.  Horseradish.  Spicy and acidic foods, including peppers, chili powder, curry powder, vinegar, hot sauces, and barbecue sauce.  Citrus fruit juices and citrus fruits, such as oranges, lemons, and limes.  Tomato-based foods, such as red sauce, chili, salsa, and pizza with red sauce.  Fried and fatty foods, such as donuts, french fries, potato chips, and high-fat dressings.  High-fat meats, such as hot dogs and fatty cuts of red and white meats, such as rib eye steak, sausage, ham, and bacon.  High-fat dairy items, such as whole milk, butter, and cream cheese.  Eat small, frequent meals instead of large meals.  Avoid drinking large amounts of liquid with your meals.  Avoid eating meals during the 2-3 hours before bedtime.  Avoid lying down right after you eat.  Do not exercise right after you eat. General Instructions  Pay attention to any changes  in your symptoms.  Take over-the-counter and prescription medicines only as told by your health care provider. Do not take aspirin, ibuprofen, or other NSAIDs unless your health care provider told you to do so.  Do not use any tobacco products, including cigarettes, chewing tobacco, and e-cigarettes. If you need help quitting, ask your health care provider.  Wear loose-fitting clothing. Do not wear anything tight around your waist that causes pressure on your abdomen.  Raise (elevate) the head of your bed 6 inches (15cm).  Try to reduce your stress, such as with yoga or meditation. If you need help reducing stress, ask your health care provider.  If you are overweight, reduce your weight to an amount that is healthy for you. Ask your health care provider for guidance about a safe weight loss goal.  Keep all follow-up visits as told by your health care provider. This is important. SEEK MEDICAL CARE IF:  You have new symptoms.  You have unexplained weight loss.  You have difficulty swallowing, or it hurts to swallow.  You have wheezing or a persistent cough.  Your symptoms do not improve with treatment.  You have a hoarse voice. SEEK IMMEDIATE MEDICAL CARE IF:  You have pain in your arms, neck, jaw, teeth, or back.  You feel sweaty, dizzy, or light-headed.  You have chest pain or shortness of breath.  You vomit and your vomit looks like blood or coffee grounds.  You faint.  Your stool is bloody or black.  You cannot swallow, drink, or eat.   This information is not intended to replace advice given to you by your health care provider. Make sure you discuss any questions you have with your health care provider.   Document Released: 07/10/2005 Document Revised: 06/21/2015 Document Reviewed: 01/25/2015 Elsevier Interactive Patient Education [employer].

## 2016-01-02 NOTE — Progress Notes (Signed)
Subjective:    Patient ID: Melody Wilcox, female    DOB: 06-Oct-1966, 50 y.o.   MRN: [id number]  Chief Complaint  Patient presents with  . Hospitalization Follow-up    HPI Patient is in today for hospitalization follow up.  Patient reports having some nagging abdominal pain, and states she has some frequent bloating, patient also reports has a dry cough. Denies dyspepsia. No bloody or tarry stool. Bowels move most days. Denies CP/palp/SOB/HA/congestion/fevers or GU c/o. Taking meds as prescribed  Past Medical History  Diagnosis Date  . Hyperlipidemia   . Hypertension   . Chicken pox middle school  . IBS (irritable bowel syndrome) 2002  . Enuresis     childhood, resolved  . Allergy     PCN, sulfa, Lidocaine cream and morphine  . History of cervical cancer 12/15/2013  . Lateral epicondylitis of right elbow 09/04/2014  . Constipation 03/19/2015    Past Surgical History  Procedure Laterality Date  . Radical hysterectomy    . Breast reduction surgery  2008  . Abdominal hysterectomy      Family History  Problem Relation Age of Onset  . Cancer Mother 89    breast cancer  . Fibromyalgia Mother   . Hypertension Father   . Heart disease Father   . Hypertension Paternal Uncle   . Gout Brother   . Cancer Maternal Grandmother 52    breast  . Diabetes Maternal Grandfather     type 2  . Heart disease Paternal Grandmother   . Other Paternal Grandfather     brain tumor  . Gout Sister     Social History   Social History  . Marital Status: Married    Spouse Name: N/A  . Number of Children: N/A  . Years of Education: N/A   Occupational History  . Not on file.   Social History Main Topics  . Smoking status: Never Smoker   . Smokeless tobacco: Never Used  . Alcohol Use: 4.2 oz/week    7 Glasses of wine per week     Comment: socially -- glass of wine a ngiht.  . Drug Use: No  . Sexual Activity: Yes    Birth Control/ Protection: Surgical     Comment: live with   Other  Topics Concern  . Not on file   Social History Narrative    Outpatient Prescriptions Prior to Visit  Medication Sig Dispense Refill  . cholecalciferol (VITAMIN D) 1000 units tablet Take 1,000 Units by mouth daily.    . fluticasone (FLONASE) 50 MCG/ACT nasal spray Place 2 sprays into both nostrils daily as needed for allergies or rhinitis.    . Glucos-Chondroit-Hyaluron-MSM (GLUCOSAMINE CHONDROITIN JOINT PO) Take 1 tablet by mouth daily.    Marland Kitchen loperamide (IMODIUM A-D) 2 MG tablet Take 1 tablet (2 mg total) by mouth 4 (four) times daily as needed for diarrhea or loose stools. 30 tablet 0  . losartan (COZAAR) 25 MG tablet Take 1 tablet (25 mg total) by mouth daily. HTN 30 tablet 8  . MILK THISTLE PO Take 1 tablet by mouth daily.    Marland Kitchen omega-3 acid ethyl esters (LOVAZA) 1 g capsule Take 1 g by mouth daily.    Marland Kitchen omeprazole (PRILOSEC) 40 MG capsule TAKE 1 CAPSULE (40 MG TOTAL) BY MOUTH DAILY. TAKE IN THE MORNING AND AT DINNERTIME. 60 capsule 4  . ondansetron (ZOFRAN) 4 MG tablet Take 1 tablet (4 mg total) by mouth every 8 (eight) hours as needed for nausea  or vomiting. 20 tablet 0  . tiZANidine (ZANAFLEX) 4 MG tablet Take 1 tablet (4 mg total) by mouth Nightly. (Patient taking differently: Take 4 mg by mouth every 6 (six) hours as needed for muscle spasms. ) 30 tablet 2  . traZODone (DESYREL) 50 MG tablet 1 tab po qhs (Patient taking differently: Take 12.5 mg by mouth at bedtime. ) 30 tablet 2   No facility-administered medications prior to visit.    Allergies  Allergen Reactions  . Morphine And Related Hives  . Other Swelling    Numbing Creams  . Penicillins Other (See Comments)    Childhood allergy   . Sulfa Antibiotics Other (See Comments)    Childhood allergy  . Tape Other (See Comments)    Pulls off skin    Review of Systems  Constitutional: Negative for fever and malaise/fatigue.  HENT: Negative for congestion.   Eyes: Negative for blurred vision.  Respiratory: Negative for  shortness of breath.   Cardiovascular: Negative for chest pain, palpitations and leg swelling.  Gastrointestinal: Positive for nausea and abdominal pain. Negative for diarrhea, constipation and blood in stool.  Genitourinary: Negative for dysuria and frequency.  Musculoskeletal: Negative for falls.  Skin: Negative for rash.  Neurological: Negative for dizziness, loss of consciousness and headaches.  Endo/Heme/Allergies: Negative for environmental allergies.  Psychiatric/Behavioral: Negative for depression. The patient is nervous/anxious.        Objective:    Physical Exam  Constitutional: She is oriented to person, place, and time. She appears well-developed and well-nourished. No distress.  HENT:  Head: Normocephalic and atraumatic.  Eyes: Conjunctivae are normal.  Neck: Neck supple. No thyromegaly present.  Cardiovascular: Normal rate, regular rhythm and normal heart sounds.   No murmur heard. Pulmonary/Chest: Effort normal and breath sounds normal. No respiratory distress.  Abdominal: Soft. Bowel sounds are normal. She exhibits no distension and no mass. There is no tenderness.  Musculoskeletal: She exhibits no edema.  Lymphadenopathy:    She has no cervical adenopathy.  Neurological: She is alert and oriented to person, place, and time.  Skin: Skin is warm and dry.  Psychiatric: She has a normal mood and affect. Her behavior is normal.    BP 120/76 mmHg  Pulse 63  Temp(Src) 97.8 F (36.6 C) (Oral)  Ht 5\' 3"  (1.6 m)  Wt 125 lb 2 oz (56.756 kg)  BMI 22.17 kg/m2  SpO2 98% Wt Readings from Last 3 Encounters:  01/02/16 125 lb 2 oz (56.756 kg)  09/21/15 126 lb (57.153 kg)  05/23/15 126 lb 12.8 oz (57.516 kg)     Lab Results  Component Value Date   WBC 4.8 01/02/2016   HGB 15.0 01/02/2016   HCT 44.6 01/02/2016   PLT 415.0* 01/02/2016   GLUCOSE 107* 01/02/2016   CHOL 235* 01/02/2016   TRIG 96.0 01/02/2016   HDL 81.00 01/02/2016   LDLCALC 135* 01/02/2016   ALT 20  01/02/2016   AST 23 01/02/2016   NA 139 01/02/2016   K 4.5 01/02/2016   CL 103 01/02/2016   CREATININE 0.70 01/02/2016   BUN 17 01/02/2016   CO2 30 01/02/2016   TSH 1.72 01/02/2016    Lab Results  Component Value Date   TSH 1.72 01/02/2016   Lab Results  Component Value Date   WBC 4.8 01/02/2016   HGB 15.0 01/02/2016   HCT 44.6 01/02/2016   MCV 91.1 01/02/2016   PLT 415.0* 01/02/2016   Lab Results  Component Value Date   NA 139  01/02/2016   K 4.5 01/02/2016   CO2 30 01/02/2016   GLUCOSE 107* 01/02/2016   BUN 17 01/02/2016   CREATININE 0.70 01/02/2016   BILITOT 0.6 01/02/2016   ALKPHOS 53 01/02/2016   AST 23 01/02/2016   ALT 20 01/02/2016   PROT 7.1 01/02/2016   ALBUMIN 4.4 01/02/2016   CALCIUM 9.7 01/02/2016   ANIONGAP 14 12/16/2015   GFR 93.39 01/02/2016   Lab Results  Component Value Date   CHOL 235* 01/02/2016   Lab Results  Component Value Date   HDL 81.00 01/02/2016   Lab Results  Component Value Date   LDLCALC 135* 01/02/2016   Lab Results  Component Value Date   TRIG 96.0 01/02/2016   Lab Results  Component Value Date   CHOLHDL 3 01/02/2016   No results found for: HGBA1C     Assessment & Plan:   Problem List Items Addressed This Visit    Abdominal pain, epigastric    Avoid offending foods, start probiotics. Do not eat large meals in late evening and consider raising head of bed. Continue Prilosec daily and switch to NOW probiotic had a recent gastroenteritis vs food poisoning. With persistent symptoms will work up with ultrasound, labs and refer       Relevant Orders   Amylase (Completed)   Lipase (Completed)   US Abdomen Complete   CBC (Completed)   Comprehensive metabolic panel (Completed)   Lipid panel (Completed)   TSH (Completed)   Ambulatory referral to Gastroenterology   H. pylori antibody, IgG (Completed)   HTN (hypertension) - Primary    Well controlled, no changes to meds. Encouraged heart healthy diet such as the DASH  diet and exercise as tolerated.       Relevant Orders   Amylase (Completed)   Lipase (Completed)   US Abdomen Complete   CBC (Completed)   Comprehensive metabolic panel (Completed)   Lipid panel (Completed)   TSH (Completed)   Ambulatory referral to Gastroenterology   Hyperlipidemia   Relevant Orders   Amylase (Completed)   Lipase (Completed)   US Abdomen Complete   CBC (Completed)   Comprehensive metabolic panel (Completed)   Lipid panel (Completed)   TSH (Completed)   Ambulatory referral to Gastroenterology    Other Visit Diagnoses    Bloating        Relevant Orders    Amylase (Completed)    Lipase (Completed)    US Abdomen Complete    CBC (Completed)    Comprehensive metabolic panel (Completed)    Lipid panel (Completed)    TSH (Completed)    Ambulatory referral to Gastroenterology    H. pylori antibody, IgG (Completed)    Nausea and vomiting, intractability of vomiting not specified, unspecified vomiting type        Relevant Orders    Amylase (Completed)    Lipase (Completed)    US Abdomen Complete    CBC (Completed)    Comprehensive metabolic panel (Completed)    Lipid panel (Completed)    TSH (Completed)    Ambulatory referral to Gastroenterology    H. pylori antibody, IgG (Completed)       I am having Ms. Temme maintain her fluticasone, losartan, tiZANidine, traZODone, cholecalciferol, omega-3 acid ethyl esters, MILK THISTLE PO, Glucos-Chondroit-Hyaluron-MSM (GLUCOSAMINE CHONDROITIN JOINT PO), loperamide, ondansetron, and omeprazole.  No orders of the defined types were placed in this encounter.     Penni Homans, MD

## 2016-01-02 NOTE — Assessment & Plan Note (Signed)
Well controlled, no changes to meds. Encouraged heart healthy diet such as the DASH diet and exercise as tolerated.  °

## 2016-01-02 NOTE — Progress Notes (Signed)
Pre visit review using our clinic review tool, if applicable. No additional management support is needed unless otherwise documented below in the visit note. 

## 2016-01-02 NOTE — Assessment & Plan Note (Signed)
Avoid offending foods, start probiotics. Do not eat large meals in late evening and consider raising head of bed. Continue Prilosec daily and switch to NOW probiotic had a recent gastroenteritis vs food poisoning. With persistent symptoms will work up with ultrasound, labs and refer

## 2016-01-05 ENCOUNTER — Ambulatory Visit (HOSPITAL_BASED_OUTPATIENT_CLINIC_OR_DEPARTMENT_OTHER)
Admission: RE | Admit: 2016-01-05 | Discharge: 2016-01-05 | Disposition: A | Discharge status: Home / Self Care | Payer: BLUE CROSS/BLUE SHIELD | Source: Ambulatory Visit | Attending: Family Medicine | Admitting: Family Medicine

## 2016-01-05 DIAGNOSIS — E785 Hyperlipidemia, unspecified: Secondary | ICD-10-CM | POA: Diagnosis not present

## 2016-01-05 DIAGNOSIS — R112 Nausea with vomiting, unspecified: Secondary | ICD-10-CM

## 2016-01-05 DIAGNOSIS — I10 Essential (primary) hypertension: Secondary | ICD-10-CM | POA: Diagnosis not present

## 2016-01-05 DIAGNOSIS — R1013 Epigastric pain: Secondary | ICD-10-CM | POA: Diagnosis present

## 2016-01-05 DIAGNOSIS — N289 Disorder of kidney and ureter, unspecified: Secondary | ICD-10-CM | POA: Insufficient documentation

## 2016-01-05 DIAGNOSIS — R140 Abdominal distension (gaseous): Secondary | ICD-10-CM | POA: Diagnosis not present

## 2016-01-07 ENCOUNTER — Other Ambulatory Visit: Payer: Self-pay | Admitting: Family Medicine

## 2016-01-07 DIAGNOSIS — N289 Disorder of kidney and ureter, unspecified: Secondary | ICD-10-CM

## 2016-01-09 ENCOUNTER — Encounter: Payer: Self-pay | Admitting: Family Medicine

## 2016-01-09 NOTE — Progress Notes (Signed)
The title of the MR says abdomen with contrast. What else do I need to say? It says with contrast already

## 2016-01-09 NOTE — Progress Notes (Signed)
Not sure what they are addressing. I ordered th MR of abdomen with contrast. The MR of the breasts was ordered elswhere. Please let them know

## 2016-01-09 NOTE — Progress Notes (Signed)
Please look into this, I have specified with contrast for MR, can you please check with radiology to see what else needs to be done.

## 2016-01-10 ENCOUNTER — Other Ambulatory Visit: Payer: Self-pay | Admitting: Physician Assistant

## 2016-01-10 ENCOUNTER — Telehealth: Payer: Self-pay

## 2016-01-10 ENCOUNTER — Other Ambulatory Visit: Payer: Self-pay | Admitting: Family Medicine

## 2016-01-10 DIAGNOSIS — N289 Disorder of kidney and ureter, unspecified: Secondary | ICD-10-CM

## 2016-01-10 NOTE — Telephone Encounter (Signed)
Spoke with Bennetta Laos who states he will re-order Abdominal MRI with and without contrast for patient. Called [facility] to make them aware, states they will call patient to come in as soon as order shows up in system.. Called patient to make her aware. States she will not be able to do a closed MRI. Advised  Patient to let Imaging know when they call her for appointment. Patient agreed.

## 2016-01-10 NOTE — Progress Notes (Signed)
Dr. Charlett Blake the order needs to be placed with and without contrast per Angelita Ingles at [facility]. (And)

## 2016-01-15 ENCOUNTER — Telehealth: Payer: Self-pay | Admitting: Family Medicine

## 2016-01-15 MED ORDER — LORAZEPAM 0.5 MG PO TABS: 0.5000 mg | ORAL_TABLET | Freq: Two times a day (BID) | ORAL | Status: DC | PRN

## 2016-01-15 NOTE — Telephone Encounter (Signed)
She can have a prescription for Ativan 0.5 mg tabs 1 tab po bid prn anxiety. Disp #5, no refill

## 2016-01-15 NOTE — Telephone Encounter (Signed)
Order has been placed needs to be signed and faxed please make patient aware.

## 2016-01-15 NOTE — Telephone Encounter (Signed)
Caller name:Self  Can be reached: [phone]  Pharmacy: [facility]  Reason for call: Having an MRI on Saturday and need something to make her relax because she is claustrophobic.

## 2016-01-16 MED FILL — LORazepam 0.5 MG TABS: 0.5 | 2 days supply | Qty: 5 | Fill #0

## 2016-01-16 NOTE — Telephone Encounter (Signed)
Patient aware and will fax to [place]

## 2016-01-18 ENCOUNTER — Telehealth: Payer: Self-pay | Admitting: Family Medicine

## 2016-01-18 NOTE — Telephone Encounter (Signed)
She did mention this to me at her visit. Is there anyway to address this

## 2016-01-18 NOTE — Telephone Encounter (Signed)
Pt called in because she says that she was sent to Dr. Tamala Julian to be seen. She says that she was charged for an ultrasound that she never received. Pt says that she was informed by PCP to call in if providers office (Dr. Tamala Julian) didn't assist and she would have someone in our office to help with the billing.    CB: [phone]

## 2016-01-20 ENCOUNTER — Ambulatory Visit (HOSPITAL_BASED_OUTPATIENT_CLINIC_OR_DEPARTMENT_OTHER)
Admission: RE | Admit: 2016-01-20 | Discharge: 2016-01-20 | Disposition: A | Discharge status: Home / Self Care | Payer: BLUE CROSS/BLUE SHIELD | Source: Ambulatory Visit | Attending: Physician Assistant | Admitting: Physician Assistant

## 2016-01-20 DIAGNOSIS — D1771 Benign lipomatous neoplasm of kidney: Secondary | ICD-10-CM | POA: Diagnosis not present

## 2016-01-20 DIAGNOSIS — N2889 Other specified disorders of kidney and ureter: Secondary | ICD-10-CM | POA: Diagnosis present

## 2016-01-20 DIAGNOSIS — N289 Disorder of kidney and ureter, unspecified: Secondary | ICD-10-CM

## 2016-01-20 MED ORDER — GADOBENATE DIMEGLUMINE 529 MG/ML IV SOLN: 11.0000 mL | Freq: Once | INTRAVENOUS | Status: DC | PRN

## 2016-01-25 ENCOUNTER — Encounter: Payer: BLUE CROSS/BLUE SHIELD | Admitting: Family Medicine

## 2016-02-06 ENCOUNTER — Encounter: Payer: Self-pay | Admitting: Gastroenterology

## 2016-02-20 ENCOUNTER — Telehealth: Payer: Self-pay | Admitting: Family Medicine

## 2016-02-20 NOTE — Telephone Encounter (Signed)
Can be reached: [phone] Pharmacy: [facility]  Reason for call: Pt is asking if she can get refill/be prescribed daily dose of lorazepam for stress/anxiety. She took before when she was having an MRI. She would like for daily stress as that med didn't make her out of it.

## 2016-02-20 NOTE — Telephone Encounter (Signed)
So she can have some Lorazepam but she needs to understand this is a benzodiazepine which has some habituating potential so to take it intermittently as opposed to regularly is better, She can have an rx for same strength, sig: 1 tab po daily prn anxiety, disp #30 with 1 rf but then needs appt before this is done for evaluation and documentation

## 2016-02-21 NOTE — Telephone Encounter (Signed)
Called and [facility] @ 2:58pm @ [phone]) asking the pt to RTC regarding medication refill request below.//AB/CMA

## 2016-02-22 NOTE — Telephone Encounter (Signed)
Called gave the patient PCP instructions regarding medication. She does not want to take anything that would have the potential to be habit forming.  She would like to know if Alprazolam could be an option?  She took only 1/2 of the lorazepam before the MRI it seemed to help with her anxiousness/stress.  She states she does have a lot of stress in her life for now and feels she needs a little something, but apprehensive to start on something that she would have to continue.

## 2016-02-22 NOTE — Telephone Encounter (Signed)
So unfortunately Alprazolam is essentially the same as Lorazepam it is in the same family with the same habituating potential so if she took it sparingly she would be fine but to take it routinely does increase the risk of having trouble coming off of it. Most of these type meds have that potential. She want to consider coming in to discuss her options

## 2016-02-22 NOTE — Telephone Encounter (Signed)
Called the patient informed of PCP information.  The patient stated she will just try when she can to schedule an appointment with PCP to discuss.

## 2016-02-22 NOTE — Telephone Encounter (Signed)
Called left message to call back 

## 2016-03-25 ENCOUNTER — Telehealth: Payer: Self-pay | Admitting: Family Medicine

## 2016-03-25 DIAGNOSIS — I10 Essential (primary) hypertension: Secondary | ICD-10-CM

## 2016-03-25 MED ORDER — LOSARTAN POTASSIUM 25 MG PO TABS: 25.0000 mg | ORAL_TABLET | Freq: Every day | ORAL | Status: DC

## 2016-03-25 NOTE — Telephone Encounter (Signed)
Refills done.

## 2016-03-25 NOTE — Telephone Encounter (Signed)
Caller name: Remo Lipps with [facility] Can be reached: [phone] Pharmacy:  [id number] - [place], [place]  Request refills on losartan. Pt is out she believes. No refills remaining /rx expired.

## 2016-03-26 NOTE — Progress Notes (Signed)
Cardiology Office Note   Date:  03/27/2016   ID:  Melody Wilcox, DOB May 30, 1966, MRN [id number]  PCP:  Penni Homans, MD  Cardiologist:   Dorris Carnes, MD    Pt referred for eval of SOB  FHx of heart problems     History of Present Illness: Melody Wilcox is a 50 y.o. female with a history of HTN and HL and IBS   Pt complains of t SOB esp with exercise  Had a stress test 2 years ago  2014  Exercised 11 min  NO ST changes  Diastolic BP elevated at rest    Pt says she is SOB all the time  Out of shape    Job up and down stairs all day   2 months ago got really SOB  No wheezing  Dry cough   One episode of PND  Occasional CP  Can last hours  Describes as a  Pressure  Sometimes sharp   Dizzy the other morning  Brief   Does note that food sometimes gets stuck in throat  March labs Total chol was 235  LDL was 135  HDL 81    Outpatient Prescriptions Prior to Visit  Medication Sig Dispense Refill  . fluticasone (FLONASE) 50 MCG/ACT nasal spray Place 2 sprays into both nostrils daily as needed for allergies or rhinitis.    . Glucos-Chondroit-Hyaluron-MSM (GLUCOSAMINE CHONDROITIN JOINT PO) Take 1 tablet by mouth daily.    Marland Kitchen losartan (COZAAR) 25 MG tablet Take 1 tablet (25 mg total) by mouth daily. HTN 30 tablet 8  . MILK THISTLE PO Take 1 tablet by mouth daily. 400 MG daily    . omega-3 acid ethyl esters (LOVAZA) 1 g capsule Take 1 g by mouth daily.    Marland Kitchen omeprazole (PRILOSEC) 40 MG capsule TAKE 1 CAPSULE (40 MG TOTAL) BY MOUTH DAILY. TAKE IN THE MORNING AND AT DINNERTIME. 60 capsule 4  . cholecalciferol (VITAMIN D) 1000 units tablet Take 1,000 Units by mouth daily.    Marland Kitchen loperamide (IMODIUM A-D) 2 MG tablet Take 1 tablet (2 mg total) by mouth 4 (four) times daily as needed for diarrhea or loose stools. (Patient not taking: Reported on 03/27/2016) 30 tablet 0  . LORazepam (ATIVAN) 0.5 MG tablet Take 1 tablet (0.5 mg total) by mouth 2 (two) times daily as needed for anxiety. (Patient not taking:  Reported on 03/27/2016) 5 tablet 0  . ondansetron (ZOFRAN) 4 MG tablet Take 1 tablet (4 mg total) by mouth every 8 (eight) hours as needed for nausea or vomiting. (Patient not taking: Reported on 03/27/2016) 20 tablet 0  . tiZANidine (ZANAFLEX) 4 MG tablet Take 1 tablet (4 mg total) by mouth Nightly. (Patient not taking: Reported on 03/27/2016) 30 tablet 2  . traZODone (DESYREL) 50 MG tablet 1 tab po qhs (Patient not taking: Reported on 03/27/2016) 30 tablet 2   No facility-administered medications prior to visit.     Allergies:   Morphine and related; Penicillins; Sulfa antibiotics; Other; and Tape   Past Medical History  Diagnosis Date  . Hyperlipidemia   . Hypertension   . Chicken pox middle school  . IBS (irritable bowel syndrome) 2002  . Enuresis     childhood, resolved  . Allergy     PCN, sulfa, Lidocaine cream and morphine  . History of cervical cancer 12/15/2013  . Lateral epicondylitis of right elbow 09/04/2014  . Constipation 03/19/2015    Past Surgical History  Procedure Laterality Date  . Radical  hysterectomy    . Breast reduction surgery  2008  . Abdominal hysterectomy       Social History:  The patient  reports that she has never smoked. She has never used smokeless tobacco. She reports that she drinks about 4.2 oz of alcohol per week. She reports that she does not use illicit drugs.   Family History:  The patient's family history includes Cancer (age of onset: 31) in her mother; Cancer (age of onset: 51) in her maternal grandmother; Diabetes in her maternal grandfather; Fibromyalgia in her mother; Gout in her brother and sister; Heart disease in her father and paternal grandmother; Hypertension in her father and paternal uncle; Other in her paternal grandfather.    ROS:  Please see the history of present illness. All other systems are reviewed and  Negative to the above problem except as noted.    PHYSICAL EXAM: VS:  BP 134/78 mmHg  Pulse 64  Ht 5' 3"  (1.6 m)  Wt  124 lb 6.4 oz (56.427 kg)  BMI 22.04 kg/m2  At rest O2 sat 98%  With walking decreased to 87% on RA   GEN: Well nourished, well developed, in no acute distress HEENT: normal Neck: no JVD, carotid bruits, or masses Cardiac: RRR; no murmurs, rubs, or gallops,no edema  Respiratory:  clear to auscultation bilaterally, normal work of breathing GI: soft, nontender, nondistended, + BS  No hepatomegaly  MS: no deformity Moving all extremities   Skin: warm and dry, no rash Neuro:  Strength and sensation are intact Psych: euthymic mood, full affect   EKG:  EKG is ordered today.  SR 64 bpm  Septal MI  Nonsepcifc T wave changes     Lipid Panel    Component Value Date/Time   CHOL 235* 01/02/2016 0925   TRIG 96.0 01/02/2016 0925   HDL 81.00 01/02/2016 0925   CHOLHDL 3 01/02/2016 0925   VLDL 19.2 01/02/2016 0925   LDLCALC 135* 01/02/2016 0925      Wt Readings from Last 3 Encounters:  03/27/16 124 lb 6.4 oz (56.427 kg)  01/02/16 125 lb 2 oz (56.756 kg)  09/21/15 126 lb (57.153 kg)       ASSESSMENT AND PLAN:  1  Dyspnea  Concerning   Oxygen goes down with walking   No wheezing on exam  Moving air well  No signs of volume overload Fingers do turn dusky with waking  (she says she has had some Raynaud's symptoms in past but fingers turned pale)  P2 does not appear prominent  No definite RV heave I would recomm an echo to evaluate RV and LV function and PA pressures  Check CBC, ESR, ANA, BMET, D Dimer  Will need CT once labs back ? Pulm HTN  ? Connective tissue disorder  Take activities as tolerated for now    2  HTN  Follow for now  3  HL  LDL 135  Follow for now     Signed, Dorris Carnes, MD  03/27/2016 2:55 PM    [address], [place], [place]  [address] Phone: [phone]; Fax: [phone]

## 2016-03-27 ENCOUNTER — Encounter: Payer: Self-pay | Admitting: Internal Medicine

## 2016-03-27 ENCOUNTER — Ambulatory Visit (INDEPENDENT_AMBULATORY_CARE_PROVIDER_SITE_OTHER): Payer: BLUE CROSS/BLUE SHIELD | Admitting: Internal Medicine

## 2016-03-27 VITALS — BP 134/78 | HR 64 | Ht 63.0 in | Wt 124.4 lb

## 2016-03-27 DIAGNOSIS — R0602 Shortness of breath: Secondary | ICD-10-CM

## 2016-03-27 NOTE — Patient Instructions (Addendum)
Your physician recommends that you continue on your current medications as directed. Please refer to the Current Medication list given to you today.  Your physician recommends that you return for lab work today (DDIMER, CBC, BMET, SED RATE, ANA)  Your physician has requested that you have an echocardiogram. Echocardiography is a painless test that uses sound waves to create images of your heart. It provides your doctor with information about the size and shape of your heart and how well your heart's chambers and valves are working. This procedure takes approximately one hour. There are no restrictions for this procedure.

## 2016-03-28 ENCOUNTER — Telehealth: Payer: Self-pay | Admitting: Internal Medicine

## 2016-03-28 LAB — BASIC METABOLIC PANEL
BUN: 20 mg/dL (ref 7–25)
CHLORIDE: 101 mmol/L (ref 98–110)
CO2: 22 mmol/L (ref 20–31)
Calcium: 9.9 mg/dL (ref 8.6–10.4)
Creat: 0.96 mg/dL (ref 0.50–1.05)
Glucose, Bld: 86 mg/dL (ref 65–99)
Potassium: 4.9 mmol/L (ref 3.5–5.3)
SODIUM: 140 mmol/L (ref 135–146)

## 2016-03-28 LAB — D-DIMER, QUANTITATIVE (NOT AT ARMC): D DIMER QUANT: 0.21 ug{FEU}/mL (ref <0.50)

## 2016-03-28 NOTE — Telephone Encounter (Signed)
Called back representative stated that missing "lavender tube" for Sed. Rate and BMET. Questions reviewed with lab tech stated he will look into and call pt if needs to get repeat blood work.

## 2016-03-28 NOTE — Telephone Encounter (Signed)
This has been worked on by billing and is no longer being denied, balance is now pending with insurance.

## 2016-03-28 NOTE — Telephone Encounter (Signed)
New Message  [facility] Did not receive PT Results: lavender for CVC and fedrate. Please call back

## 2016-03-29 LAB — CBC

## 2016-03-29 LAB — ANA: Anti Nuclear Antibody(ANA): NEGATIVE

## 2016-03-29 LAB — SEDIMENTATION RATE

## 2016-04-01 ENCOUNTER — Other Ambulatory Visit: Payer: Self-pay | Admitting: *Deleted

## 2016-04-01 DIAGNOSIS — R0602 Shortness of breath: Secondary | ICD-10-CM

## 2016-04-02 ENCOUNTER — Ambulatory Visit (HOSPITAL_COMMUNITY)
Admission: RE | Admit: 2016-04-02 | Discharge: 2016-04-02 | Disposition: A | Discharge status: Home / Self Care | Payer: BLUE CROSS/BLUE SHIELD | Source: Ambulatory Visit | Attending: Internal Medicine | Admitting: Internal Medicine

## 2016-04-02 ENCOUNTER — Other Ambulatory Visit: Payer: BLUE CROSS/BLUE SHIELD

## 2016-04-02 DIAGNOSIS — I340 Nonrheumatic mitral (valve) insufficiency: Secondary | ICD-10-CM | POA: Insufficient documentation

## 2016-04-02 DIAGNOSIS — I10 Essential (primary) hypertension: Secondary | ICD-10-CM | POA: Diagnosis not present

## 2016-04-02 DIAGNOSIS — R0602 Shortness of breath: Secondary | ICD-10-CM

## 2016-04-02 DIAGNOSIS — R0600 Dyspnea, unspecified: Secondary | ICD-10-CM | POA: Diagnosis present

## 2016-04-02 LAB — ECHOCARDIOGRAM COMPLETE
CHL CUP MV DEC (S): 218
E decel time: 218 msec
E/e' ratio: 6.52
FS: 30 % (ref 28–44)
IVS/LV PW RATIO, ED: 1.28
LA ID, A-P, ES: 23 mm
LA vol A4C: 26.4 ml
LA vol index: 20.4 mL/m2
LA vol: 32.3 mL
LADIAMINDEX: 1.46 cm/m2
LDCA: 3.14 cm2
LEFT ATRIUM END SYS DIAM: 23 mm
LV E/e' medial: 6.52
LV PW d: 7.59 mm — AB (ref 0.6–1.1)
LV TDI E'LATERAL: 11.9
LV TDI E'MEDIAL: 8.92
LV dias vol index: 29 mL/m2
LV dias vol: 46 mL (ref 46–106)
LV e' LATERAL: 11.9 cm/s
LV sys vol index: 11 mL/m2
LV sys vol: 17 mL (ref 14–42)
LVEEAVG: 6.52
LVOT diameter: 20 mm
MVPG: 2 mmHg
MVPKAVEL: 59.7 m/s
MVPKEVEL: 77.6 m/s
Simpson's disk: 64
Stroke v: 29 ml

## 2016-04-02 NOTE — Progress Notes (Signed)
  Echocardiogram 2D Echocardiogram has been performed.  Melody Wilcox 04/02/2016, 12:03 PM

## 2016-04-04 ENCOUNTER — Encounter: Payer: Self-pay | Admitting: Gastroenterology

## 2016-04-04 ENCOUNTER — Ambulatory Visit (INDEPENDENT_AMBULATORY_CARE_PROVIDER_SITE_OTHER): Payer: BLUE CROSS/BLUE SHIELD | Admitting: Gastroenterology

## 2016-04-04 ENCOUNTER — Telehealth: Payer: Self-pay

## 2016-04-04 ENCOUNTER — Telehealth: Payer: Self-pay | Admitting: Gastroenterology

## 2016-04-04 VITALS — BP 138/90 | HR 68 | Ht 63.0 in | Wt 124.4 lb

## 2016-04-04 DIAGNOSIS — R0602 Shortness of breath: Secondary | ICD-10-CM

## 2016-04-04 DIAGNOSIS — R1013 Epigastric pain: Secondary | ICD-10-CM

## 2016-04-04 DIAGNOSIS — K589 Irritable bowel syndrome without diarrhea: Secondary | ICD-10-CM | POA: Diagnosis not present

## 2016-04-04 DIAGNOSIS — R140 Abdominal distension (gaseous): Secondary | ICD-10-CM | POA: Diagnosis not present

## 2016-04-04 NOTE — Telephone Encounter (Signed)
While doing my office note from today's visit for epigastric pain, I realized that she is overdue for a screening colonoscopy.  She had one 15 or so years ago for some GI problems, but should have another now since she is over 89. Please ask her if she is interested in scheduling it.  My apologies, I should have thought of it when she was in clinic this morning.

## 2016-04-04 NOTE — Telephone Encounter (Signed)
CT is scheduled 6/23.

## 2016-04-04 NOTE — Progress Notes (Signed)
[facility] Consult Note:  History: Melody Wilcox 04/04/2016  Referring physician: Penni Homans, MD  Reason for consult/chief complaint: Abdominal Pain and Cough   Subjective HPI:  Melody Wilcox was referred to see [place] for abdominal pain and nausea. She was previously diagnosed with IBS in 2001, and describes having had Domino bloating pain and diarrhea. She reports that EGD and colonoscopy were both done and were normal. Was felt to be a large stress component, because her symptoms resolve this and she got divorced. She also reports increased stress at work and in life over the last several months. For the last 5 years she has had frequent episodes of epigastric pain that often occur after meals and may be associated with abdominal bloating and distention. On times there is associated nausea.  Despite that, she denies dysphagia or weight loss. She typically has a BM every 1 or 2 days and there is no rectal bleeding. She recently saw cardiology for chronic dyspnea, reports that her oxygen level decreased when she was ambulating on room air. This prompted an echocardiogram which was normal, but her cardiologist is planning a CT angiogram.  ROS:  Review of Systems  Constitutional: Positive for fatigue.  Respiratory: Positive for cough and shortness of breath.   Cardiovascular: Positive for palpitations.  Musculoskeletal: Positive for back pain.  Allergic/Immunologic: Positive for environmental allergies.  Psychiatric/Behavioral: The patient is nervous/anxious.    She has chronic Raynaud's phenomenon of hands and feet. She also has insomnia, usually sleeping 3 or 4 hours per night with difficulty.  Past Medical History: Past Medical History  Diagnosis Date  . Hyperlipidemia   . Hypertension   . Chicken pox middle school  . IBS (irritable bowel syndrome) 2002  . Enuresis     childhood, resolved  . Allergy     PCN, sulfa, Lidocaine cream and morphine  . History of cervical  cancer 12/15/2013  . Lateral epicondylitis of right elbow 09/04/2014  . Constipation 03/19/2015     Past Surgical History: Past Surgical History  Procedure Laterality Date  . Radical hysterectomy  1995  . Breast reduction surgery  2008    1998     Family History: Family History  Problem Relation Age of Onset  . Cancer Mother 40    breast cancer  . Fibromyalgia Mother   . Hypertension Father   . Heart disease Father   . Hypertension Paternal Uncle   . Gout Brother   . Cancer Maternal Grandmother 17    breast  . Diabetes Maternal Grandfather     type 2  . Heart disease Paternal Grandmother   . Other Paternal Grandfather     brain tumor  . Gout Sister     Social History: Social History   Social History  . Marital Status: Married    Spouse Name: N/A  . Number of Children: N/A  . Years of Education: N/A   Occupational History  . Law [employer]    Social History Main Topics  . Smoking status: Never Smoker   . Smokeless tobacco: Never Used  . Alcohol Use: 4.2 oz/week    7 Glasses of wine per week     Comment: socially -- glass of wine a ngiht.  . Drug Use: No  . Sexual Activity: Yes    Birth Control/ Protection: Surgical     Comment: live with   Other Topics Concern  . None   Social History Narrative    Allergies: Allergies  Allergen Reactions  . Morphine  And Related Hives and Rash  . Penicillins Other (See Comments)    Was told as a child Childhood allergy   . Sulfa Antibiotics Other (See Comments)    Was told as a child.  Childhood allergy  . Other Swelling    Numbing Creams  . Tape Other (See Comments)    Pulls off skin    Outpatient Meds: Current Outpatient Prescriptions  Medication Sig Dispense Refill  . Cholecalciferol (VITAMIN D-3 PO) Take 4,000 Units by mouth daily.    . fluticasone (FLONASE) 50 MCG/ACT nasal spray Place 2 sprays into both nostrils daily as needed for allergies or rhinitis.    . Glucos-Chondroit-Hyaluron-MSM (GLUCOSAMINE  CHONDROITIN JOINT PO) Take 1 tablet by mouth daily.    Marland Kitchen losartan (COZAAR) 25 MG tablet Take 1 tablet (25 mg total) by mouth daily. HTN 30 tablet 8  . MILK THISTLE PO Take 1 tablet by mouth daily. 400 MG daily    . omega-3 acid ethyl esters (LOVAZA) 1 g capsule Take 2 g by mouth daily.     Marland Kitchen omeprazole (PRILOSEC) 40 MG capsule TAKE 1 CAPSULE (40 MG TOTAL) BY MOUTH DAILY. TAKE IN THE MORNING AND AT DINNERTIME. 60 capsule 4  . Probiotic Product (PROBIOTIC PO) Take 1 capsule by mouth daily.      No current facility-administered medications for this visit.      ___________________________________________________________________ Objective  Exam:  BP 138/90 mmHg  Pulse 68  Ht 5\' 3"  (1.6 m)  Wt 124 lb 6.4 oz (56.427 kg)  BMI 22.04 kg/m2   General: this is a(n) Well-appearing middle-aged woman, thin but no muscle wasting   Eyes: sclera anicteric, no redness  ENT: oral mucosa moist without lesions, no cervical or supraclavicular lymphadenopathy, good dentition  CV: RRR without murmur, S1/S2, no JVD, no peripheral edema  Resp: clear to auscultation bilaterally, normal RR and effort noted  GI: soft, mild epigastric tenderness, with active bowel sounds. No guarding or palpable organomegaly noted.  Skin; warm and dry, no rash or jaundice noted  Neuro: awake, alert and oriented x 3. Normal gross motor function and fluent speech  Labs: She had a negative H. pylori serology several years ago. Radiologic Studies:  Recent abdominal ultrasound was normal except for a renal lesion which was benign on follow-up MRI  Assessment: Encounter Diagnoses  Name Primary?  . IBS (irritable bowel syndrome) Yes  . Abdominal pain, epigastric   . Abdominal bloating     She has many years of what sound like symptoms of functional dyspepsia. There are no red flag symptoms. I don't think an upper endoscopy is likely to be revealing. She is really not interested in taking prescription medicines, and  we typically have few prescription meds to offer that are very helpful in this scenario. She also feels there is a significant stress component.  Plan: She has opted to try the OTC treatment FD gard. She is also due for screening colonoscopy.  Thank you for the courtesy of this consult.  Please call me with any questions or concerns.  Nelida Meuse III  CC: Penni Homans, MD

## 2016-04-04 NOTE — Telephone Encounter (Signed)
-----   Message from Fay Records, MD sent at 04/02/2016  4:33 PM EDT ----- Echo is normal  RV, LV are normal I would recomm CT angio of the test  R/O PE I have discussed this with pt  She is aware that someone will call her to schedule

## 2016-04-04 NOTE — Patient Instructions (Signed)
If you are age 52 or older, your body mass index should be between 23-30. Your Body mass index is 22.04 kg/(m^2). If this is out of the aforementioned range listed, please consider follow up with your Primary Care Provider.  If you are age 29 or younger, your body mass index should be between 19-25. Your Body mass index is 22.04 kg/(m^2). If this is out of the aformentioned range listed, please consider follow up with your Primary Care Provider.   Thank you for choosing [facility]  Dr Wilfrid Lund III

## 2016-04-04 NOTE — Telephone Encounter (Signed)
Chest CT ordered for scheduling.

## 2016-04-04 NOTE — Telephone Encounter (Signed)
Left message to return call 

## 2016-04-05 ENCOUNTER — Ambulatory Visit (INDEPENDENT_AMBULATORY_CARE_PROVIDER_SITE_OTHER)
Admission: RE | Admit: 2016-04-05 | Discharge: 2016-04-05 | Disposition: A | Discharge status: Home / Self Care | Payer: BLUE CROSS/BLUE SHIELD | Source: Ambulatory Visit | Attending: Internal Medicine | Admitting: Internal Medicine

## 2016-04-05 DIAGNOSIS — R0602 Shortness of breath: Secondary | ICD-10-CM

## 2016-04-05 MED ORDER — IOPAMIDOL (ISOVUE-370) INJECTION 76%
80.0000 mL | Freq: Once | INTRAVENOUS | Status: AC | PRN
  Administered 2016-04-05: 80 mL via INTRAVENOUS

## 2016-04-08 ENCOUNTER — Telehealth: Payer: Self-pay | Admitting: Internal Medicine

## 2016-04-08 NOTE — Telephone Encounter (Signed)
New message     Patient calling for CT test results done on  6.23.2017

## 2016-04-08 NOTE — Telephone Encounter (Signed)
I spoke with pt and told her Dr. Harrington Challenger had not officially read study yet but that CT showed no evidence of pulmonary embolism.  I told her we would call her back once Dr. Harrington Challenger had reviewed. Pt reports she continues to have shortness of breath.  She reports it comes and goes.

## 2016-04-09 NOTE — Telephone Encounter (Signed)
I spoke to pt Want to talk to pulmonary about results No changes for now She should come back to get labs drawn (CBC, ESR)

## 2016-04-10 NOTE — Telephone Encounter (Signed)
I spoke with Dr. Harrington Challenger. She is requesting patient return for labs and when she does ambulate and measure pulse oximetry.  Spoke with patient. Advised to come for labs this week, Thursday or Friday.  She will come on Thursday.  Lab appointment made.

## 2016-04-11 ENCOUNTER — Other Ambulatory Visit (INDEPENDENT_AMBULATORY_CARE_PROVIDER_SITE_OTHER): Payer: BLUE CROSS/BLUE SHIELD | Admitting: *Deleted

## 2016-04-11 DIAGNOSIS — R0602 Shortness of breath: Secondary | ICD-10-CM

## 2016-04-11 LAB — CBC
HEMATOCRIT: 44 % (ref 35.0–45.0)
HEMOGLOBIN: 15 g/dL (ref 11.7–15.5)
MCH: 30.5 pg (ref 27.0–33.0)
MCHC: 34.1 g/dL (ref 32.0–36.0)
MCV: 89.6 fL (ref 80.0–100.0)
MPV: 9.5 fL (ref 7.5–12.5)
Platelets: 340 10*3/uL (ref 140–400)
RBC: 4.91 MIL/uL (ref 3.80–5.10)
RDW: 13.8 % (ref 11.0–15.0)
WBC: 6.4 10*3/uL (ref 3.8–10.8)

## 2016-04-11 LAB — SEDIMENTATION RATE: SED RATE: 1 mm/h (ref 0–30)

## 2016-04-11 NOTE — Telephone Encounter (Signed)
Patient in for lab work today. Walk with pulse ox around the office.  Prior to starting patient reports she has Raynaud's. Her fingers slightly dusky to start, warmed fingers--standing still pulsx ox/ HR 99%, 68  Walked down long hall and around to back hallway: 93%/82 Completed long hallway and back up to lobby: 94%, 82--but by this time her fingers are cold and tips are purple. She did feel like her chest was tight and like she it was more difficult to take a breath although her breathing was not labored or fast.   Warmed her fingers (<30 seconds): 99%, 74  Advised that once Dr. Harrington Challenger reviews this and her labs we will be in touch with her.

## 2016-04-12 ENCOUNTER — Encounter: Payer: BLUE CROSS/BLUE SHIELD | Admitting: Family Medicine

## 2016-04-17 ENCOUNTER — Telehealth: Payer: Self-pay | Admitting: *Deleted

## 2016-04-17 DIAGNOSIS — R0602 Shortness of breath: Secondary | ICD-10-CM

## 2016-04-17 NOTE — Telephone Encounter (Signed)
Informed patient of normal test results and recommendation for pulmonary function test. She is agreeable to the testing. She states for the last 2 days she has felt like she has a knot in her chest, like something is stuck. She wonders if it is what indigestion feels like. Taking Prilosec daily. She has not tried anything else to relieve it.

## 2016-04-17 NOTE — Telephone Encounter (Signed)
-----  Message from Fay Records, MD sent at 04/11/2016  4:42 PM EDT ----- CBC and ESR are normal Pulmonary has recomm full PFTs with [facility]

## 2016-04-19 NOTE — Telephone Encounter (Signed)
No return call from patient. Mailed a recall colonoscopy letter to patients address on file.

## 2016-04-24 ENCOUNTER — Telehealth: Payer: Self-pay | Admitting: Internal Medicine

## 2016-04-24 DIAGNOSIS — R05 Cough: Secondary | ICD-10-CM

## 2016-04-24 DIAGNOSIS — R0602 Shortness of breath: Secondary | ICD-10-CM

## 2016-04-24 DIAGNOSIS — R059 Cough, unspecified: Secondary | ICD-10-CM

## 2016-04-24 NOTE — Telephone Encounter (Signed)
New Message  Pt requested to speak w/ Michalene about PFT- stated was fine to wait until michalene was back in office. Please call back and discuss.

## 2016-04-25 NOTE — Telephone Encounter (Signed)
Spoke with patient who did not want to schedule PFTs at this time because she is still concerned about getting a bill for the CT Angio, and what if the PFTs are not covered as well.  Advised patient that billing is continuing to work on the issue with the [facility] and when there is any update someone will be contacting her. She verbalizes understanding but prefers to wait anyway.  Also updated that she has a cough that has been getting worse.  It is dry but over the weekend she had sputum that was blood tinged/clear. She states "its probably just allergies, I will have to wait until I have the PFTs to see."

## 2016-05-01 NOTE — Telephone Encounter (Signed)
Fay Records, MD   Sent: Thu April 18, 2016 10:57 AM    To: Rodman Key, RN        Result Note     I think probably better to set pt up for cardiopulmonary stress test She is rel asymptomatic at rest PFTs may miss        Per billing the billing issue has been resolved.  Spoke with patient and she is aware of this. Informed that it is recommended she have a CPX.  Explained the test to patient.  She does not have any questions. She is aware someone will be calling her to schedule.  No need to hold any medication prior.

## 2016-05-08 ENCOUNTER — Encounter (HOSPITAL_COMMUNITY): Payer: BLUE CROSS/BLUE SHIELD

## 2016-05-23 ENCOUNTER — Ambulatory Visit (HOSPITAL_COMMUNITY): Payer: BLUE CROSS/BLUE SHIELD | Attending: Internal Medicine

## 2016-05-23 DIAGNOSIS — R0602 Shortness of breath: Secondary | ICD-10-CM | POA: Insufficient documentation

## 2016-05-23 DIAGNOSIS — R05 Cough: Secondary | ICD-10-CM | POA: Diagnosis not present

## 2016-05-23 DIAGNOSIS — R059 Cough, unspecified: Secondary | ICD-10-CM

## 2016-05-28 ENCOUNTER — Other Ambulatory Visit: Payer: Self-pay | Admitting: Family Medicine

## 2016-05-28 DIAGNOSIS — I10 Essential (primary) hypertension: Secondary | ICD-10-CM

## 2016-05-28 MED ORDER — LOSARTAN POTASSIUM 25 MG PO TABS: 25.0000 mg | ORAL_TABLET | Freq: Every day | ORAL | 0 refills | Status: DC

## 2016-06-05 ENCOUNTER — Telehealth: Payer: Self-pay | Admitting: Internal Medicine

## 2016-06-05 DIAGNOSIS — R0602 Shortness of breath: Secondary | ICD-10-CM

## 2016-06-05 NOTE — Telephone Encounter (Signed)
New message      Pt needs results of stress test. Please call.

## 2016-06-07 ENCOUNTER — Ambulatory Visit: Payer: BLUE CROSS/BLUE SHIELD | Admitting: Family Medicine

## 2016-06-07 ENCOUNTER — Encounter: Payer: Self-pay | Admitting: Family Medicine

## 2016-06-07 ENCOUNTER — Ambulatory Visit (INDEPENDENT_AMBULATORY_CARE_PROVIDER_SITE_OTHER): Payer: BLUE CROSS/BLUE SHIELD | Admitting: Family Medicine

## 2016-06-07 VITALS — BP 130/88 | HR 63 | Temp 98.1°F | Resp 16 | Ht 63.0 in | Wt 123.0 lb

## 2016-06-07 DIAGNOSIS — L500 Allergic urticaria: Secondary | ICD-10-CM | POA: Diagnosis not present

## 2016-06-07 MED ORDER — SODIUM CHLORIDE 0.9 % IV SOLN
60.0000 mg | Freq: Once | INTRAVENOUS | Status: AC
  Administered 2016-06-07: 60 mg via INTRAMUSCULAR

## 2016-06-07 MED ORDER — PREDNISONE 50 MG PO TABS: 50.0000 mg | ORAL_TABLET | Freq: Every day | ORAL | 0 refills | Status: DC

## 2016-06-07 MED ORDER — PREDNISONE 50 MG PO TABS: 50.0000 mg | ORAL_TABLET | Freq: Every day | ORAL | 0 refills | Status: AC

## 2016-06-07 NOTE — Progress Notes (Signed)
Pre visit review using our clinic review tool, if applicable. No additional management support is needed unless otherwise documented below in the visit note. 

## 2016-06-07 NOTE — Progress Notes (Signed)
Chief Complaint  Patient presents with  . Rash    patient c/o rash on whole body head to toe, feet felt like they were on fire, started today around 5 am.    Subjective: Patient is a 50 y.o. female here for rash on body.  Started this AM around 0500. She felt like her feet were on fire. The rash currently itches and is red. No pain. No new soaps, lotions, detergents, medications, or travel. No swelling or difficulty breathing. She took some Claritin earlier that is helping with the itching.  ROS: Const: No fevers Skin: +rash as noted above  Family History  Problem Relation Age of Onset  . Cancer Mother 61    breast cancer  . Fibromyalgia Mother   . Hypertension Father   . Heart disease Father   . Hypertension Paternal Uncle   . Gout Brother   . Cancer Maternal Grandmother 46    breast  . Diabetes Maternal Grandfather     type 2  . Heart disease Paternal Grandmother   . Other Paternal Grandfather     brain tumor  . Gout Sister    Past Medical History:  Diagnosis Date  . Allergy    PCN, sulfa, Lidocaine cream and morphine  . Chicken pox middle school  . Constipation 03/19/2015  . Enuresis    childhood, resolved  . History of cervical cancer 12/15/2013  . Hyperlipidemia   . Hypertension   . IBS (irritable bowel syndrome) 2002  . Lateral epicondylitis of right elbow 09/04/2014   Allergies  Allergen Reactions  . Morphine And Related Hives and Rash  . Penicillins Other (See Comments)    Was told as a child Childhood allergy   . Sulfa Antibiotics Other (See Comments)    Was told as a child.  Childhood allergy  . Other Swelling    Numbing Creams  . Tape Other (See Comments)    Pulls off skin    Current Outpatient Prescriptions:  .  Cholecalciferol (VITAMIN D-3 PO), Take 4,000 Units by mouth daily., Disp: , Rfl:  .  fluticasone (FLONASE) 50 MCG/ACT nasal spray, Place 2 sprays into both nostrils daily as needed for allergies or rhinitis., Disp: , Rfl:  .   Glucos-Chondroit-Hyaluron-MSM (GLUCOSAMINE CHONDROITIN JOINT PO), Take 1 tablet by mouth daily., Disp: , Rfl:  .  losartan (COZAAR) 25 MG tablet, Take 1 tablet (25 mg total) by mouth daily. HTN, Disp: 90 tablet, Rfl: 0 .  MILK THISTLE PO, Take 1 tablet by mouth daily. 400 MG daily, Disp: , Rfl:  .  omega-3 acid ethyl esters (LOVAZA) 1 g capsule, Take 2 g by mouth daily. , Disp: , Rfl:  .  [START ON 06/08/2016] predniSONE (DELTASONE) 50 MG tablet, Take 1 tablet (50 mg total) by mouth daily with breakfast., Disp: 4 tablet, Rfl: 0 .  Probiotic Product (PROBIOTIC PO), Take 1 capsule by mouth daily. , Disp: , Rfl:   Current Facility-Administered Medications:  .  methylPREDNISolone sodium succinate (SOLU-MEDROL) 60 mg in sodium chloride 0.9 % 50 mL IVPB, 60 mg, Intramuscular, Once, [facility], DO, 60 mg at 06/07/16 1649  Objective: BP 130/88 (BP Location: Right Arm, Patient Position: Sitting, Cuff Size: Normal)   Pulse 63   Temp 98.1 F (36.7 C) (Oral)   Resp 16   Ht 5\' 3"  (1.6 m)   Wt 123 lb (55.8 kg)   SpO2 100%   BMI 21.79 kg/m  General: Awake, appears stated age HEENT: No masses in  neck Heart: RRR, no murmurs Lungs: CTAB, no rales, wheezes or rhonchi. Normal effort Skin: Scattered erythematous and raised wheals. Some confluence appreciated. It does blanch. No TTP, drainage, or fluid filled lesions. Psych: Age appropriate judgment and insight, normal affect and mood  Assessment and Plan: Allergic urticaria - Plan: predniSONE (DELTASONE) 50 MG tablet, methylPREDNISolone sodium succinate (SOLU-MEDROL) 60 mg in sodium chloride 0.9 % 50 mL IVPB, DISCONTINUED: predniSONE (DELTASONE) 50 MG tablet  Orders as above. Dose of solumedrol today. Instructed to take daily antihistamine (cannot tolerate Benadryl) and daily H2 blocker for the next 5 days.  ER if she starts having swelling of tongue/throat or difficulty breathing. Did discuss possibility of Losartan being the cause. Given  the data available, will continue for now and if she has any other issues, will discuss changing with Dr. Charlett Blake. F/u prn. The patient voiced understanding and agreement to the plan.  Crosby Oyster Avilla

## 2016-06-07 NOTE — Patient Instructions (Signed)
Claritin (loratadine), Allegra (fexofenadine), Zyrtec (cetirizine) This is listed in order of weakest to strongest.  There are available OTC, and the generic versions, which may be cheaper, are in parentheses. Show this to a pharmacist if you have trouble finding any of these items.  Ranitidine (Zantac) daily is another medication that will help.

## 2016-06-13 NOTE — Telephone Encounter (Signed)
Reviewed test results with pt  WOuld recomm full PFTs with DLCO, MIP, MEP  Will review with pulmonary    Message  Received: Today  Message Contents  Fay Records, MD  Rodman Key, RN        Go ahead and order PFTs Then I will talke to pulmonary  Pt is aware      Order placed for PFTs. Staff message sent to [facility] to request scheduling/precert.

## 2016-06-18 ENCOUNTER — Encounter: Payer: Self-pay | Admitting: Physician Assistant

## 2016-06-18 ENCOUNTER — Ambulatory Visit (INDEPENDENT_AMBULATORY_CARE_PROVIDER_SITE_OTHER): Payer: BLUE CROSS/BLUE SHIELD | Admitting: Physician Assistant

## 2016-06-18 VITALS — BP 98/68 | HR 76 | Temp 98.0°F | Resp 16 | Ht 63.0 in | Wt 123.4 lb

## 2016-06-18 DIAGNOSIS — R21 Rash and other nonspecific skin eruption: Secondary | ICD-10-CM

## 2016-06-18 DIAGNOSIS — J00 Acute nasopharyngitis [common cold]: Secondary | ICD-10-CM | POA: Diagnosis not present

## 2016-06-18 DIAGNOSIS — B9689 Other specified bacterial agents as the cause of diseases classified elsewhere: Secondary | ICD-10-CM

## 2016-06-18 DIAGNOSIS — J208 Acute bronchitis due to other specified organisms: Principal | ICD-10-CM

## 2016-06-18 MED ORDER — BENZONATATE 100 MG PO CAPS: 100.0000 mg | ORAL_CAPSULE | Freq: Three times a day (TID) | ORAL | 0 refills | Status: DC | PRN

## 2016-06-18 MED ORDER — TRIAMCINOLONE ACETONIDE 0.1 % EX CREA: 1.0000 "application " | TOPICAL_CREAM | Freq: Two times a day (BID) | CUTANEOUS | 0 refills | Status: DC

## 2016-06-18 MED ORDER — ALBUTEROL SULFATE HFA 108 (90 BASE) MCG/ACT IN AERS: 2.0000 | INHALATION_SPRAY | Freq: Four times a day (QID) | RESPIRATORY_TRACT | 0 refills | Status: DC | PRN

## 2016-06-18 MED ORDER — DOXYCYCLINE HYCLATE 100 MG PO TABS: 100.0000 mg | ORAL_TABLET | Freq: Two times a day (BID) | ORAL | 0 refills | Status: DC

## 2016-06-18 MED FILL — BENZONATATE 100 MG CAPSULE: 100 | 10 days supply | Qty: 30 | Fill #0

## 2016-06-18 MED FILL — VENTOLIN HFA 90 MCG INHALER: 108 (90 BAS | 30 days supply | Qty: 18 | Fill #0

## 2016-06-18 MED FILL — DOXYCYCLINE HYCLATE 100 MG: 100 | 7 days supply | Qty: 14 | Fill #0

## 2016-06-18 MED FILL — TRIAMCINOLONE 0.1% CREAM: 0.1 | 15 days supply | Qty: 30 | Fill #0
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
## 2016-06-18 NOTE — Patient Instructions (Signed)
Take antibiotic (Doxycycline) as directed.  Increase fluids.  Get plenty of rest. Use Mucinex for congestion. Tessalon as directed. Use the albuterol as directed if needed for chest tightness. Take a daily probiotic (I recommend Align or Culturelle, but even Activia Yogurt may be beneficial).  A humidifier placed in the bedroom may offer some relief for a dry, scratchy throat of nasal irritation.  Read information below on acute bronchitis. Please call or return to clinic if symptoms are not improving.  Acute Bronchitis Bronchitis is when the airways that extend from the windpipe into the lungs get red, puffy, and painful (inflamed). Bronchitis often causes thick spit (mucus) to develop. This leads to a cough. A cough is the most common symptom of bronchitis. In acute bronchitis, the condition usually begins suddenly and goes away over time (usually in 2 weeks). Smoking, allergies, and asthma can make bronchitis worse. Repeated episodes of bronchitis may cause more lung problems.  HOME CARE  Rest.  Drink enough fluids to keep your pee (urine) clear or pale yellow (unless you need to limit fluids as told by your doctor).  Only take over-the-counter or prescription medicines as told by your doctor.  Avoid smoking and secondhand smoke. These can make bronchitis worse. If you are a smoker, think about using nicotine gum or skin patches. Quitting smoking will help your lungs heal faster.  Reduce the chance of getting bronchitis again by:  Washing your hands often.  Avoiding people with cold symptoms.  Trying not to touch your hands to your mouth, nose, or eyes.  Follow up with your doctor as told.  GET HELP IF: Your symptoms do not improve after 1 week of treatment. Symptoms include:  Cough.  Fever.  Coughing up thick spit.  Body aches.  Chest congestion.  Chills.  Shortness of breath.  Sore throat.  GET HELP RIGHT AWAY IF:   You have an increased fever.  You have  chills.  You have severe shortness of breath.  You have bloody thick spit (sputum).  You throw up (vomit) often.  You lose too much body fluid (dehydration).  You have a severe headache.  You faint.  MAKE SURE YOU:   Understand these instructions.  Will watch your condition.  Will get help right away if you are not doing well or get worse. Document Released: 03/18/2008 Document Revised: 06/02/2013 Document Reviewed: 03/23/2013 [facility] Patient Information 2015 [place], [place]. This information is not intended to replace advice given to you by your health care provider. Make sure you discuss any questions you have with your health care provider.

## 2016-06-21 ENCOUNTER — Encounter (HOSPITAL_COMMUNITY): Payer: BLUE CROSS/BLUE SHIELD

## 2016-06-25 IMAGING — MR MR ABDOMEN WO/W CM
8 of 16 series · 24 of 48 positions shown · IV contrast (11mL  MULTIHANCE)
Comparison: None.

CLINICAL DATA: Evaluate kidney lesion

EXAM:
MRI ABDOMEN WITHOUT AND WITH CONTRAST
TECHNIQUE: Multiplanar multisequence MR imaging of the abdomen was performed
both before and after the administration of intravenous contrast.
CONTRAST:  11 cc of MultiHance

[Series 3: T2 · coronal · 7.0mm · 1.56mm/px · 3 of 31 slices shown (1 of 2)]
[im 1/31]
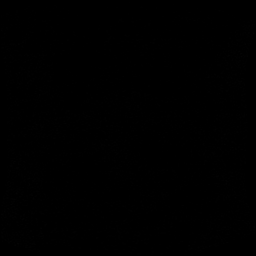
[im 16/31]
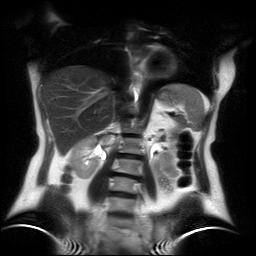
[im 31/31]
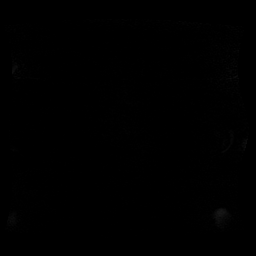

[Series 4: T2 fat-sat · axial · 6.0mm · 1.48mm/px · z∈[-70,+147]mm · 3 of 30 slices shown]
[im 1/30]
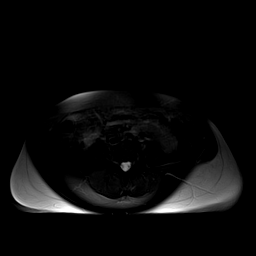
[im 15/30]
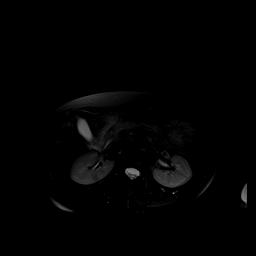
[im 30/30]
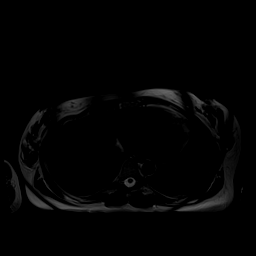

[Series 5: in + out · axial · 6.0mm · 0.74mm/px · z∈[-88,+145]mm · 4 of 64 slices shown]
[im 1/64]
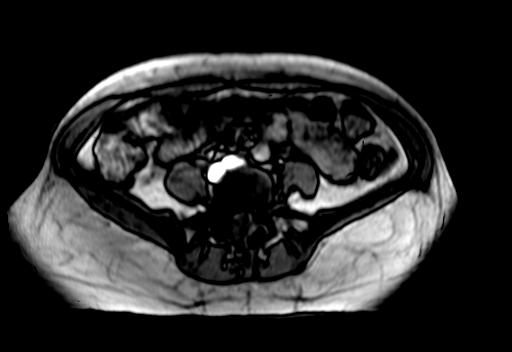
[im 22/64]
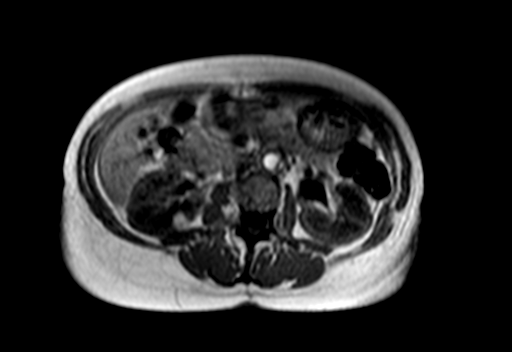
[im 43/64]
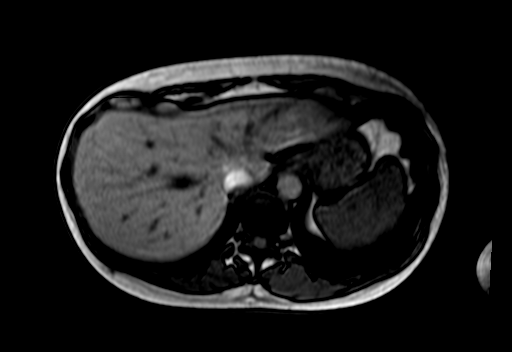
[im 64/64]
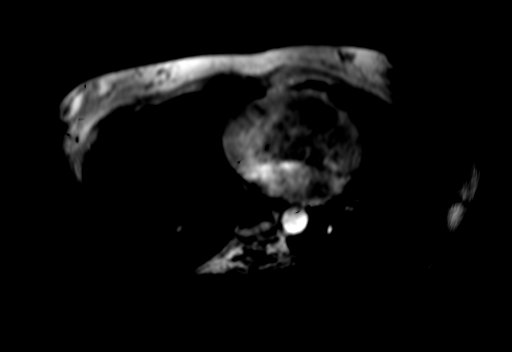

[Series 6: [id number] free breathing · axial · 6.0mm · 2.08mm/px · z∈[-71,+147]mm · 5 of 90 slices shown]
[im 1/90]
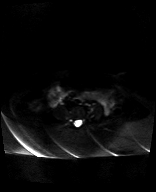
[im 23/90]
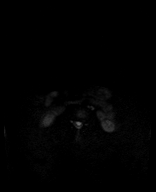
[im 45/90]
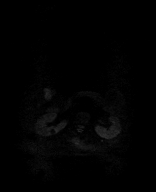
[im 67/90]
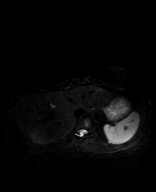
[im 90/90]
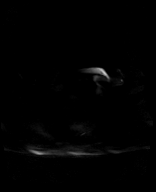

[Series 7: [id number] free breathing_adc · axial · 6.0mm · 2.08mm/px · z∈[-71,+147]mm · 2 of 30 slices shown]
[im 1/30]
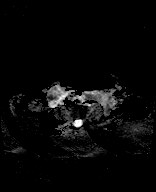
[im 30/30]
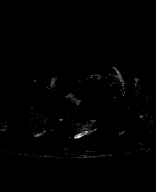

[Series 8: DWI · axial · 6.0mm · 0.74mm/px · z∈[-110,+167]mm · 2 of 38 slices shown]
[im 1/38]
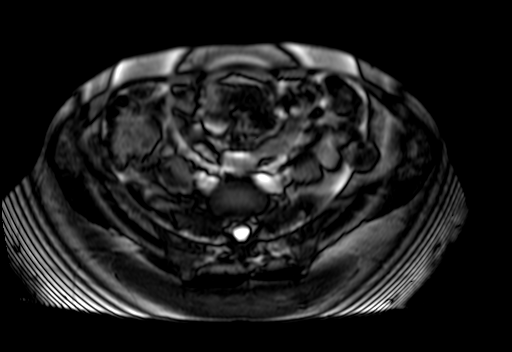
[im 38/38]
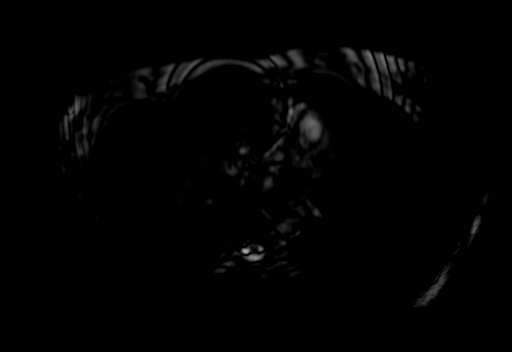

[Series 9: T2 · axial · 6.0mm · 1.48mm/px · z∈[-71,+147]mm · 2 of 30 slices shown (2 of 2)]
[im 1/30]
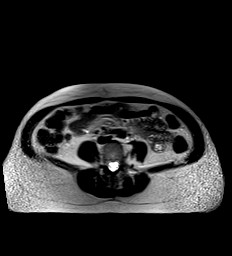
[im 30/30]
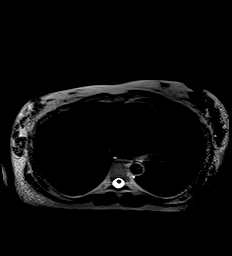

[Series 18: T1 dynamic fat-sat post-contrast · axial · delayed · 4.0mm · 0.78mm/px · z∈[-89,+147]mm · 3 of 60 slices shown]
[im 1/60]
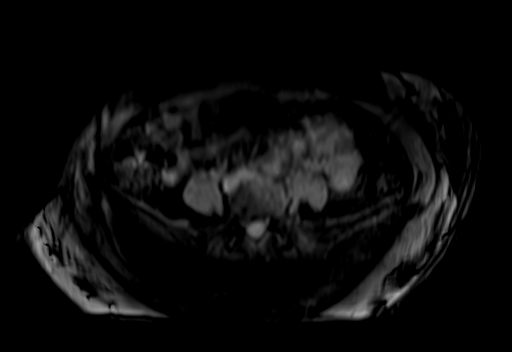
[im 30/60]
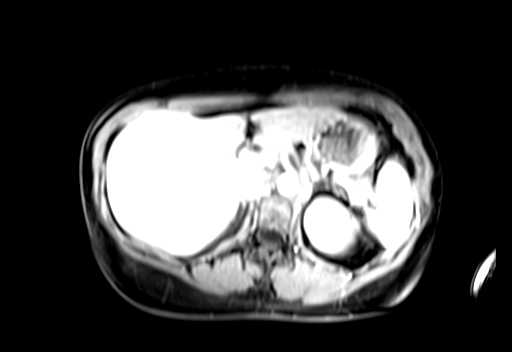
[im 60/60]
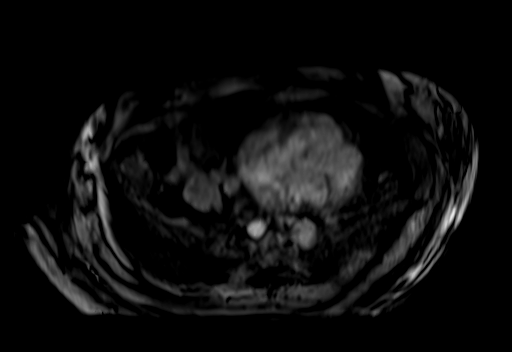

[24 of 48 positions shown; findings below may reference images not displayed]

FINDINGS: Lower chest: No pleural effusion identified. No pericardial
effusion.

Hepatobiliary: No focal liver abnormality identified. The
gallbladder appears normal. No biliary dilatation.

Pancreas: The the pancreatic duct has a normal caliber. No
inflammation or mass.

Spleen: Normal size.

Adrenals/Urinary Tract: The adrenal glands are unremarkable. Fat
signal intensity lesion arising from the posterior cortex of the
right kidney is identified measuring 1.1 cm and is compatible with a
benign angiomyolipoma (AML.) no suspicious kidney lesions
identified.

Stomach/Bowel: The stomach is normal. No pathologic dilatation of
the upper abdominal bowel loops.

Vascular/Lymphatic: The abdominal aorta appears normal. No upper
abdominal adenopathy identified.

Other: No free fluid or fluid collections within the upper abdomen.

Musculoskeletal: Normal signal identified from within the bone
marrow.
IMPRESSION: 1. Right kidney angiomyolipoma.

## 2016-07-04 ENCOUNTER — Telehealth: Payer: Self-pay | Admitting: Internal Medicine

## 2016-07-04 NOTE — Telephone Encounter (Signed)
Pt is scheduled for PFT.

## 2016-07-04 NOTE — Telephone Encounter (Signed)
New message    Pt needs to know what number to call to schedule her PFT. Please call.

## 2016-07-04 NOTE — Telephone Encounter (Signed)
Routing to [facility] to call patient and schedule PFTs

## 2016-07-04 NOTE — Telephone Encounter (Signed)
New message     Pt calling wanting the nurse to call her and let her know where the doc wants her to go for the Pulmonary Function tests. Please call.

## 2016-07-04 NOTE — Telephone Encounter (Signed)
I called pt about getting her PFT's LMOM with [facility] Phone #.  I also advised pt could call me and gave my extension  And I would help her schedule.   Davy Pique

## 2016-07-11 ENCOUNTER — Encounter (HOSPITAL_COMMUNITY): Payer: BLUE CROSS/BLUE SHIELD

## 2016-07-11 ENCOUNTER — Ambulatory Visit (HOSPITAL_COMMUNITY)
Admission: RE | Admit: 2016-07-11 | Discharge: 2016-07-11 | Disposition: A | Discharge status: Home / Self Care | Payer: BLUE CROSS/BLUE SHIELD | Source: Ambulatory Visit | Attending: Internal Medicine | Admitting: Internal Medicine

## 2016-07-11 DIAGNOSIS — R0602 Shortness of breath: Secondary | ICD-10-CM | POA: Insufficient documentation

## 2016-07-11 LAB — PULMONARY FUNCTION TEST
DL/VA % pred: 133 %
DL/VA: 6.24 ml/min/mmHg/L
DLCO unc % pred: 100 %
DLCO unc: 22.92 ml/min/mmHg
FEF 25-75 Post: 1.07 L/sec
FEF 25-75 Pre: 2.33 L/sec
FEF2575-%CHANGE-POST: -53 %
FEF2575-%Pred-Post: 41 %
FEF2575-%Pred-Pre: 89 %
FEV1-%CHANGE-POST: -20 %
FEV1-%PRED-POST: 67 %
FEV1-%PRED-PRE: 84 %
FEV1-POST: 1.78 L
FEV1-PRE: 2.24 L
FEV1FVC-%CHANGE-POST: -10 %
FEV1FVC-%Pred-Pre: 100 %
FEV6-%Change-Post: -13 %
FEV6-%PRED-PRE: 80 %
FEV6-%Pred-Post: 70 %
FEV6-PRE: 2.65 L
FEV6-Post: 2.29 L
FEV6FVC-%PRED-PRE: 103 %
FEV6FVC-%Pred-Post: 103 %
FVC-%CHANGE-POST: -11 %
FVC-%Pred-Post: 72 %
FVC-%Pred-Pre: 82 %
FVC-POST: 2.45 L
FVC-Pre: 2.77 L
POST FEV1/FVC RATIO: 72 %
POST FEV6/FVC RATIO: 100 %
PRE FEV6/FVC RATIO: 100 %
Pre FEV1/FVC ratio: 81 %
RV % PRED: 91 %
RV: 1.64 L
TLC % pred: 81 %
TLC: 4 L

## 2016-07-11 MED ORDER — ALBUTEROL SULFATE (2.5 MG/3ML) 0.083% IN NEBU
2.5000 mg | INHALATION_SOLUTION | Freq: Once | RESPIRATORY_TRACT | Status: AC
  Administered 2016-07-11: 2.5 mg via RESPIRATORY_TRACT

## 2016-07-12 NOTE — Telephone Encounter (Signed)
Follow Up ° ° ° °Returning call from earlier. Please call. °

## 2016-07-12 NOTE — Telephone Encounter (Signed)
Left message for patient to call back  

## 2016-07-12 NOTE — Telephone Encounter (Signed)
New Message  Pt voiced severe pain right lung, constant cough, pt wants to someone to call her today and pt voiced it's very painful.  Please f/u

## 2016-07-12 NOTE — Telephone Encounter (Signed)
Spoke with patient who states she is having right lung pain and cough that started on 07/04/16 after taking a course of doxycycline and using an inhaler for what she thought was a sinus infection.  She states her sinus symptoms cleared but she continues to have pain every time she takes a breath and/or coughs.  I asked if her PCP did a chest xray when she was treated for sinus infection and she denies.  She states she went to [facility] and worked in her parent's house for 2 days and is concerned about the mold she was exposed to.  I advised her to call back to her PCP for follow-up since she was seen for infection 2 weeks ago.  I advised that the results of the PFTs are not yet available.  She verbalized understanding and agreement with the plan and thanked me for the call.

## 2016-07-15 ENCOUNTER — Ambulatory Visit (INDEPENDENT_AMBULATORY_CARE_PROVIDER_SITE_OTHER): Payer: BLUE CROSS/BLUE SHIELD | Admitting: Family Medicine

## 2016-07-15 ENCOUNTER — Encounter: Payer: Self-pay | Admitting: Family Medicine

## 2016-07-15 ENCOUNTER — Ambulatory Visit: Payer: BLUE CROSS/BLUE SHIELD | Admitting: Family Medicine

## 2016-07-15 VITALS — BP 110/70 | HR 68 | Temp 98.0°F | Ht 63.0 in | Wt 124.4 lb

## 2016-07-15 DIAGNOSIS — R05 Cough: Secondary | ICD-10-CM | POA: Diagnosis not present

## 2016-07-15 DIAGNOSIS — R0789 Other chest pain: Secondary | ICD-10-CM | POA: Diagnosis not present

## 2016-07-15 DIAGNOSIS — R059 Cough, unspecified: Secondary | ICD-10-CM

## 2016-07-15 MED ORDER — BENZONATATE 100 MG PO CAPS: 100.0000 mg | ORAL_CAPSULE | Freq: Three times a day (TID) | ORAL | 0 refills | Status: DC | PRN

## 2016-07-15 MED FILL — BENZONATATE 100 MG CAPSULE: 100 | 7 days supply | Qty: 20 | Fill #0

## 2016-07-15 NOTE — Progress Notes (Signed)
Chief Complaint  Patient presents with  . Chest Pain    (R) side-since last Thurs-along with cough(dry)-mainly painful when she sits up from lying down.    Melody Wilcox is 50 y.o. and is here for a cough.  Duration: 11 days Productive? No Associated symptoms: chest pain Denies: fever, nasal congestion, rhinorrhea, sore throat, wheezing ACEi? No  GERD? No  ROS:  Resp: +Cough Cardio: No chest pain  Past Medical History:  Diagnosis Date  . Allergy    PCN, sulfa, Lidocaine cream and morphine  . Chicken pox middle school  . Constipation 03/19/2015  . Enuresis    childhood, resolved  . History of cervical cancer 12/15/2013  . Hyperlipidemia   . Hypertension   . IBS (irritable bowel syndrome) 2002  . Lateral epicondylitis of right elbow 09/04/2014   Family History  Problem Relation Age of Onset  . Cancer Mother 53    breast cancer  . Fibromyalgia Mother   . Hypertension Father   . Heart disease Father   . Hypertension Paternal Uncle   . Gout Brother   . Cancer Maternal Grandmother 20    breast  . Diabetes Maternal Grandfather     type 2  . Heart disease Paternal Grandmother   . Other Paternal Grandfather     brain tumor  . Gout Sister      Medication List       Accurate as of 07/15/16  4:54 PM. Always use your most recent med list.          albuterol 108 (90 Base) MCG/ACT inhaler Commonly known as:  PROVENTIL HFA;VENTOLIN HFA Inhale 2 puffs into the lungs every 6 (six) hours as needed for wheezing or shortness of breath.   benzonatate 100 MG capsule Commonly known as:  TESSALON Take 1 capsule (100 mg total) by mouth 3 (three) times daily as needed for cough.   fluocinonide cream 0.05 % Commonly known as:  LIDEX Apply 1 application topically 2 (two) times daily.   fluticasone 50 MCG/ACT nasal spray Commonly known as:  FLONASE Place 2 sprays into both nostrils daily as needed for allergies or rhinitis.   GLUCOSAMINE CHONDROITIN JOINT PO Take 1 tablet by  mouth daily.   losartan 25 MG tablet Commonly known as:  COZAAR Take 1 tablet (25 mg total) by mouth daily. HTN   MILK THISTLE PO Take 1 tablet by mouth daily. 400 MG daily   omega-3 acid ethyl esters 1 g capsule Commonly known as:  LOVAZA Take 2 g by mouth daily.   PROBIOTIC PO Take 1 capsule by mouth daily.   triamcinolone cream 0.1 % Commonly known as:  KENALOG Apply 1 application topically 2 (two) times daily.   VITAMIN D-3 PO Take 2,000 Units by mouth daily.       BP 110/70 (BP Location: Left Arm, Patient Position: Sitting, Cuff Size: Normal)   Pulse 68   Temp 98 F (36.7 C) (Oral)   Ht 5\' 3"  (1.6 m)   Wt 124 lb 6.4 oz (56.4 kg)   SpO2 98%   BMI 22.04 kg/m  Gen: Awake, alert, appears stated age HEENT: Ears neg, nares patent without D/C, turbinates unremarkable, Pharynx pink without exudate Neck: Supple, no masses or asymmetry, no tenderness Heart: RRR, no murmurs, no LE edema Lungs: CTAB, normal effort, no accessory muscle use MSK: +TTP over the costochondral border on R and ribs Psych: Age appropriate judgement and insight, normal mood and affect  Cough - Plan: benzonatate (TESSALON)  100 MG capsule  Chest wall pain  Orders as above. She has an inhaler, will try to use that as well. Cough likely causing the pain. I do not think this is pulmonary or cardiac pain. F/u prn. The patient voiced understanding and agreement to the plan.  [place], DO 07/15/16 4:54 PM

## 2016-07-15 NOTE — Progress Notes (Signed)
Pre visit review using our clinic review tool, if applicable. No additional management support is needed unless otherwise documented below in the visit note. 

## 2016-07-15 NOTE — Patient Instructions (Signed)
NSAIDs- Aleve, Advil, Motrin, ibuprofen is best for your pain.

## 2016-08-26 ENCOUNTER — Telehealth: Payer: Self-pay | Admitting: Family Medicine

## 2016-08-26 ENCOUNTER — Ambulatory Visit: Payer: BLUE CROSS/BLUE SHIELD | Admitting: Family Medicine

## 2016-08-26 NOTE — Telephone Encounter (Signed)
Pt called in at 10:33 to cancel appt for today at 2:00 pm. Pt says that she feels better. She will call back to reschedule if needed.     Should pt be charged?

## 2016-08-26 NOTE — Telephone Encounter (Signed)
No charge. TY. 

## 2016-08-29 ENCOUNTER — Telehealth: Payer: Self-pay | Admitting: Internal Medicine

## 2016-08-29 NOTE — Telephone Encounter (Signed)
Dr. Harrington Challenger was reviewing patient's CPX with pulmonary.  Will route to her for further information.

## 2016-08-29 NOTE — Telephone Encounter (Signed)
New Message  Pt voiced wanting a nurse to return her call due to results of test she took back in September.  Please f/u with pt

## 2016-10-17 ENCOUNTER — Telehealth: Payer: Self-pay | Admitting: Family Medicine

## 2016-10-17 NOTE — Telephone Encounter (Signed)
Patient was scheduled for an appointment 10/18/16 at 11:15 patient called back asking to reschedule for 4:00. I told the patient the appointment was already taken and there was not an open slot for tomorrow. Patient got very irritated and stated that she is tried of [place] screwing up her appointments. I told her I was sorry this happened and I would try to find a spot for her, she just kept stating how we screw up her appointments and over book. I told her that I was not the one who scheduled her but I would try to fix the appointment. She stated its the whole office including me that screws up her appointments. I then stated that I could have her talk to another scheduler because she seemed very upset with me. She stated that I must be very insecure about myself if I thought that she was upset with her. I stated that if she was going to continue insulting me that I would transfer her to another scheduler or a Freight forwarder. She then told me that I must be 51 years old. She stated she would keep the 11:15 appointment and hung up on me.

## 2016-10-18 ENCOUNTER — Ambulatory Visit: Payer: BLUE CROSS/BLUE SHIELD | Admitting: Family Medicine

## 2016-10-21 ENCOUNTER — Ambulatory Visit (INDEPENDENT_AMBULATORY_CARE_PROVIDER_SITE_OTHER): Payer: BLUE CROSS/BLUE SHIELD | Admitting: Family Medicine

## 2016-10-21 ENCOUNTER — Telehealth: Payer: Self-pay | Admitting: Family Medicine

## 2016-10-21 DIAGNOSIS — I10 Essential (primary) hypertension: Secondary | ICD-10-CM

## 2016-10-21 NOTE — Telephone Encounter (Signed)
Am certainly sorry but can transfer her records wherever she would like them sent. I have other Melody Wilcox that are not as busy and she might want to try them. I can offer a recommendation of one of our other docs if she would like. I completely understand her frustration I just do not have any quick fixes for this problem. If she needs any specific thing I am happy to do that and I am also able to meet her in her early one morning if that works for her just to keep her moving forward on her active health concerns while she decides what she wants to do or finds another provider. Please let her know.

## 2016-10-21 NOTE — Progress Notes (Signed)
Pre visit review using our clinic review tool, if applicable. No additional management support is needed unless otherwise documented below in the visit note. 

## 2016-10-21 NOTE — Telephone Encounter (Signed)
Please advise note below.

## 2016-10-21 NOTE — Telephone Encounter (Signed)
Pt had appt today at 3:00 with Dr. Charlett Blake, however pt left without being seen because she was running 1 hr behind, pt expressed her frustration and stated that it is totally unfair that her appointments always get cancelled and then when she does come for her appointment she has to wait hours before being seen. Pt states she just will have to find her another doctor but is very frustrated about it and feels as though it is not fair.

## 2016-10-22 NOTE — Telephone Encounter (Signed)
Called to follow up with patient.  Left a message for call back.   

## 2016-10-22 NOTE — Progress Notes (Signed)
Patient ID: Melody Wilcox, female   DOB: Nov 07, 1965, 51 y.o.   MRN: [id number] Left without being seen due to delays.

## 2016-10-24 NOTE — Telephone Encounter (Signed)
Thank you :)

## 2016-10-24 NOTE — Telephone Encounter (Signed)
Called to follow up with patient. Pt states she would rather stay with Dr. Charlett Blake and if an early morning appt is available, she would like to do that.  She says she really likes Dr.Blyth and it has taken her awhile to find a doctor that she really likes.  She says she doesn't need a long appt, she just has some concerns that she would like to talk to her about and if we could schedule that soon, she would really appreciate that.  I apologized for her experience.  She was very appreciative that we reached out to her to follow up.    Appt scheduled with Dr. Charlett Blake on Tuesday 10/29/16 at 7:45 am.

## 2016-10-29 ENCOUNTER — Ambulatory Visit (INDEPENDENT_AMBULATORY_CARE_PROVIDER_SITE_OTHER): Payer: BLUE CROSS/BLUE SHIELD | Admitting: Family Medicine

## 2016-10-29 ENCOUNTER — Encounter: Payer: Self-pay | Admitting: Family Medicine

## 2016-10-29 DIAGNOSIS — I10 Essential (primary) hypertension: Secondary | ICD-10-CM

## 2016-10-29 DIAGNOSIS — N951 Menopausal and female climacteric states: Secondary | ICD-10-CM | POA: Diagnosis not present

## 2016-10-29 DIAGNOSIS — Z8541 Personal history of malignant neoplasm of cervix uteri: Secondary | ICD-10-CM

## 2016-10-29 DIAGNOSIS — M545 Low back pain: Secondary | ICD-10-CM

## 2016-10-29 DIAGNOSIS — F909 Attention-deficit hyperactivity disorder, unspecified type: Secondary | ICD-10-CM | POA: Diagnosis not present

## 2016-10-29 DIAGNOSIS — E782 Mixed hyperlipidemia: Secondary | ICD-10-CM

## 2016-10-29 DIAGNOSIS — K5900 Constipation, unspecified: Secondary | ICD-10-CM

## 2016-10-29 DIAGNOSIS — K589 Irritable bowel syndrome without diarrhea: Secondary | ICD-10-CM

## 2016-10-29 MED ORDER — AMPHETAMINE-DEXTROAMPHETAMINE 10 MG PO TABS: 10.0000 mg | ORAL_TABLET | Freq: Two times a day (BID) | ORAL | 0 refills | Status: DC

## 2016-10-29 MED ORDER — ALPRAZOLAM 0.25 MG PO TABS: 0.2500 mg | ORAL_TABLET | Freq: Two times a day (BID) | ORAL | 1 refills | Status: AC | PRN

## 2016-10-29 MED FILL — DEXTROAMP-AMPHETAMIN 10 MG: 10 | 30 days supply | Qty: 60 | Fill #0

## 2016-10-29 MED FILL — ALPRAZolam 0.25 MG TABS: 0.25 | 20 days supply | Qty: 40 | Fill #0 | Status: TO

## 2016-10-29 NOTE — Progress Notes (Signed)
Pre visit review using our clinic review tool, if applicable. No additional management support is needed unless otherwise documented below in the visit note. 

## 2016-10-29 NOTE — Assessment & Plan Note (Signed)
Encouraged new probiotics, fiber supplement,

## 2016-10-29 NOTE — Assessment & Plan Note (Signed)
Struggling with

## 2016-10-29 NOTE — Assessment & Plan Note (Signed)
Will try Adderall, vyvanse too expensive and Ritalin did not work.

## 2016-10-29 NOTE — Patient Instructions (Addendum)
Melody Wilcox.   Probiotics, multistrain, fiber supplements.   Hypertension Hypertension, commonly called high blood pressure, is when the force of blood pumping through your arteries is too strong. Your arteries are the blood vessels that carry blood from your heart throughout your body. A blood pressure reading consists of a higher number over a lower number, such as 110/72. The higher number (systolic) is the pressure inside your arteries when your heart pumps. The lower number (diastolic) is the pressure inside your arteries when your heart relaxes. Ideally you want your blood pressure below 120/80. Hypertension forces your heart to work harder to pump blood. Your arteries may become narrow or stiff. Having untreated or uncontrolled hypertension can cause heart attack, stroke, kidney disease, and other problems. What increases the risk? Some risk factors for high blood pressure are controllable. Others are not. Risk factors you cannot control include:  Race. You may be at higher risk if you are African American.  Age. Risk increases with age.  Gender. Men are at higher risk than women before age 71 years. After age 36, women are at higher risk than men. Risk factors you can control include:  Not getting enough exercise or physical activity.  Being overweight.  Getting too much fat, sugar, calories, or salt in your diet.  Drinking too much alcohol. What are the signs or symptoms? Hypertension does not usually cause signs or symptoms. Extremely high blood pressure (hypertensive crisis) may cause headache, anxiety, shortness of breath, and nosebleed. How is this diagnosed? To check if you have hypertension, your health care provider will measure your blood pressure while you are seated, with your arm held at the level of your heart. It should be measured at least twice using the same arm. Certain conditions can cause a difference in blood pressure between your right and left arms. A blood  pressure reading that is higher than normal on one occasion does not mean that you need treatment. If it is not clear whether you have high blood pressure, you may be asked to return on a different day to have your blood pressure checked again. Or, you may be asked to monitor your blood pressure at home for 1 or more weeks. How is this treated? Treating high blood pressure includes making lifestyle changes and possibly taking medicine. Living a healthy lifestyle can help lower high blood pressure. You may need to change some of your habits. Lifestyle changes may include:  Following the DASH diet. This diet is high in fruits, vegetables, and whole grains. It is low in salt, red meat, and added sugars.  Keep your sodium intake below 2,300 mg per day.  Getting at least 30-45 minutes of aerobic exercise at least 4 times per week.  Losing weight if necessary.  Not smoking.  Limiting alcoholic beverages.  Learning ways to reduce stress. Your health care provider may prescribe medicine if lifestyle changes are not enough to get your blood pressure under control, and if one of the following is true:  You are 49-6 years of age and your systolic blood pressure is above 140.  You are 59 years of age or older, and your systolic blood pressure is above 150.  Your diastolic blood pressure is above 90.  You have diabetes, and your systolic blood pressure is over [id number] or your diastolic blood pressure is over 90.  You have kidney disease and your blood pressure is above 140/90.  You have heart disease and your blood pressure is above 140/90. Your  personal target blood pressure may vary depending on your medical conditions, your age, and other factors. Follow these instructions at home:  Have your blood pressure rechecked as directed by your health care provider.  Take medicines only as directed by your health care provider. Follow the directions carefully. Blood pressure medicines must be taken as  prescribed. The medicine does not work as well when you skip doses. Skipping doses also puts you at risk for problems.  Do not smoke.  Monitor your blood pressure at home as directed by your health care provider. Contact a health care provider if:  You think you are having a reaction to medicines taken.  You have recurrent headaches or feel dizzy.  You have swelling in your ankles.  You have trouble with your vision. Get help right away if:  You develop a severe headache or confusion.  You have unusual weakness, numbness, or feel faint.  You have severe chest or abdominal pain.  You vomit repeatedly.  You have trouble breathing. This information is not intended to replace advice given to you by your health care provider. Make sure you discuss any questions you have with your health care provider. Document Released: 09/30/2005 Document Revised: 03/07/2016 Document Reviewed: 07/23/2013 Elsevier Interactive Patient Education  2017 [employer].

## 2016-10-29 NOTE — Assessment & Plan Note (Signed)
Encouraged heart healthy diet, increase exercise, avoid trans fats, consider a krill oil cap daily 

## 2016-10-29 NOTE — Assessment & Plan Note (Signed)
Radiating to right hip. Try Lidocaine patches prn, moist heat, stretch daily. Chiropractor might be helpful

## 2016-10-29 NOTE — Assessment & Plan Note (Signed)
Well controlled, no changes to meds. Encouraged heart healthy diet such as the DASH diet and exercise as tolerated.  °

## 2016-11-03 NOTE — Assessment & Plan Note (Signed)
Fiber supplements bid, increase fluid intake and water. Stay active. Discussed diet and lifestyle concerns with patient. Offered nutrition consult for further management.

## 2016-11-03 NOTE — Assessment & Plan Note (Signed)
Not a candidate for hrt despite her symptoms due to this history

## 2016-11-03 NOTE — Progress Notes (Signed)
Patient ID: Melody Wilcox, female   DOB: Dec 13, 1965, 51 y.o.   MRN: [id number]   Subjective:    Patient ID: Melody Wilcox, female    DOB: July 30, 1966, 51 y.o.   MRN: [id number]  No chief complaint on file.   HPI Patient is in today for follow up. She is very frustrated with her long history of perimenopausal symptoms due to her personal history of cervical cancer she is not a candidate for HRT. She has trouble sleeping, has frequent hot flashes and is irritable. She also endorses sme abrominal bloating recently and has a dry nonproductive cough. No fevers or recent acute illness if interested in restarting meds for her Adult ADD. Ritalin did not help and vyvanse worked well but was too expensive. Will try Adderall again. .Denies CP/palp/SOB/HA/congestion/fevers/GI or GU c/o. Taking meds as prescribed  Past Medical History:  Diagnosis Date  . Allergy    PCN, sulfa, Lidocaine cream and morphine  . Chicken pox middle school  . Constipation 03/19/2015  . Enuresis    childhood, resolved  . History of cervical cancer 12/15/2013  . Hyperlipidemia   . Hypertension   . IBS (irritable bowel syndrome) 2002  . Lateral epicondylitis of right elbow 09/04/2014    Past Surgical History:  Procedure Laterality Date  . BREAST REDUCTION SURGERY  2008   1998  . RADICAL HYSTERECTOMY  1995    Family History  Problem Relation Age of Onset  . Cancer Mother 73    breast cancer  . Fibromyalgia Mother   . Hypertension Father   . Heart disease Father   . Gout Brother   . Cancer Maternal Grandmother 61    breast  . Diabetes Maternal Grandfather     type 2  . Heart disease Paternal Grandmother   . Other Paternal Grandfather     brain tumor  . Hypertension Paternal Uncle   . Gout Sister     Social History   Social History  . Marital status: Married    Spouse name: N/A  . Number of children: N/A  . Years of education: N/A   Occupational History  . [employer]    Social History Main Topics  .  Smoking status: Never Smoker  . Smokeless tobacco: Never Used  . Alcohol use 4.2 oz/week    7 Glasses of wine per week     Comment: socially -- glass of wine a ngiht.  . Drug use: No  . Sexual activity: Yes    Birth control/ protection: Surgical     Comment: live with   Other Topics Concern  . Not on file   Social History Narrative  . No narrative on file    Outpatient Medications Prior to Visit  Medication Sig Dispense Refill  . Cholecalciferol (VITAMIN D-3 PO) Take 2,000 Units by mouth daily.     . fluocinonide cream (LIDEX) [id number] % Apply 1 application topically 2 (two) times daily.    . fluticasone (FLONASE) 50 MCG/ACT nasal spray Place 2 sprays into both nostrils daily as needed for allergies or rhinitis.    . Glucos-Chondroit-Hyaluron-MSM (GLUCOSAMINE CHONDROITIN JOINT PO) Take 1 tablet by mouth daily.    Marland Kitchen losartan (COZAAR) 25 MG tablet Take 1 tablet (25 mg total) by mouth daily. HTN 90 tablet 0  . MILK THISTLE PO Take 1 tablet by mouth daily. 400 MG daily    . omega-3 acid ethyl esters (LOVAZA) 1 g capsule Take 2 g by mouth daily.     Marland Kitchen  Probiotic Product (PROBIOTIC PO) Take 1 capsule by mouth daily.     Marland Kitchen albuterol (PROVENTIL HFA;VENTOLIN HFA) 108 (90 Base) MCG/ACT inhaler Inhale 2 puffs into the lungs every 6 (six) hours as needed for wheezing or shortness of breath. (Patient not taking: Reported on 07/15/2016) 1 Inhaler 0  . benzonatate (TESSALON) 100 MG capsule Take 1 capsule (100 mg total) by mouth 3 (three) times daily as needed for cough. 20 capsule 0  . triamcinolone cream (KENALOG) 0.1 % Apply 1 application topically 2 (two) times daily. (Patient not taking: Reported on 07/15/2016) 30 g 0   No facility-administered medications prior to visit.     Allergies  Allergen Reactions  . Morphine And Related Hives and Rash  . Penicillins Other (See Comments)    Was told as a child Childhood allergy   . Sulfa Antibiotics Other (See Comments)    Was told as a child.    Childhood allergy  . Other Swelling    Numbing Creams  . Tape Other (See Comments)    Pulls off skin    Review of Systems  Constitutional: Negative for fever and malaise/fatigue.  HENT: Negative for congestion.   Eyes: Negative for blurred vision.  Respiratory: Positive for cough. Negative for shortness of breath.   Cardiovascular: Negative for chest pain, palpitations and leg swelling.  Gastrointestinal: Positive for abdominal pain. Negative for blood in stool and nausea.  Genitourinary: Negative for dysuria and frequency.  Musculoskeletal: Negative for falls.  Skin: Negative for rash.  Neurological: Negative for dizziness, loss of consciousness and headaches.  Endo/Heme/Allergies: Negative for environmental allergies.  Psychiatric/Behavioral: Negative for depression. The patient is nervous/anxious and has insomnia.        Objective:    Physical Exam  Constitutional: She is oriented to person, place, and time. She appears well-developed and well-nourished. No distress.  HENT:  Head: Normocephalic and atraumatic.  Nose: Nose normal.  Eyes: Right eye exhibits no discharge. Left eye exhibits no discharge.  Neck: Normal range of motion. Neck supple.  Cardiovascular: Normal rate and regular rhythm.   No murmur heard. Pulmonary/Chest: Effort normal and breath sounds normal.  Abdominal: Soft. Bowel sounds are normal. There is no tenderness.  Musculoskeletal: She exhibits no edema.  Neurological: She is alert and oriented to person, place, and time.  Skin: Skin is warm and dry.  Psychiatric: She has a normal mood and affect.  Nursing note and vitals reviewed.   BP 118/76 (BP Location: Left Arm, Patient Position: Sitting, Cuff Size: Normal)   Pulse 76   Temp 97.6 F (36.4 C) (Oral)   Wt 128 lb (58.1 kg)   SpO2 99%   BMI 22.67 kg/m  Wt Readings from Last 3 Encounters:  10/29/16 128 lb (58.1 kg)  07/15/16 124 lb 6.4 oz (56.4 kg)  06/18/16 123 lb 6 oz (56 kg)     Lab  Results  Component Value Date   WBC 6.4 04/11/2016   HGB 15.0 04/11/2016   HCT 44.0 04/11/2016   PLT 340 04/11/2016   GLUCOSE 86 03/27/2016   CHOL 235 (H) 01/02/2016   TRIG 96.0 01/02/2016   HDL 81.00 01/02/2016   LDLCALC 135 (H) 01/02/2016   ALT 20 01/02/2016   AST 23 01/02/2016   NA 140 03/27/2016   K 4.9 03/27/2016   CL 101 03/27/2016   CREATININE 0.96 03/27/2016   BUN 20 03/27/2016   CO2 22 03/27/2016   TSH 1.72 01/02/2016    Lab Results  Component Value  Date   TSH 1.72 01/02/2016   Lab Results  Component Value Date   WBC 6.4 04/11/2016   HGB 15.0 04/11/2016   HCT 44.0 04/11/2016   MCV 89.6 04/11/2016   PLT 340 04/11/2016   Lab Results  Component Value Date   NA 140 03/27/2016   K 4.9 03/27/2016   CO2 22 03/27/2016   GLUCOSE 86 03/27/2016   BUN 20 03/27/2016   CREATININE 0.96 03/27/2016   BILITOT 0.6 01/02/2016   ALKPHOS 53 01/02/2016   AST 23 01/02/2016   ALT 20 01/02/2016   PROT 7.1 01/02/2016   ALBUMIN 4.4 01/02/2016   CALCIUM 9.9 03/27/2016   ANIONGAP 14 12/16/2015   GFR 93.39 01/02/2016   Lab Results  Component Value Date   CHOL 235 (H) 01/02/2016   Lab Results  Component Value Date   HDL 81.00 01/02/2016   Lab Results  Component Value Date   LDLCALC 135 (H) 01/02/2016   Lab Results  Component Value Date   TRIG 96.0 01/02/2016   Lab Results  Component Value Date   CHOLHDL 3 01/02/2016   No results found for: HGBA1C     Assessment & Plan:   Problem List Items Addressed This Visit    Hyperlipidemia    Encouraged heart healthy diet, increase exercise, avoid trans fats, consider a krill oil cap daily      HTN (hypertension)    Well controlled, no changes to meds. Encouraged heart healthy diet such as the DASH diet and exercise as tolerated.       IBS (irritable bowel syndrome)    Encouraged new probiotics, fiber supplement,       Perimenopause    Struggling with       Back pain    Radiating to right hip. Try Lidocaine  patches prn, moist heat, stretch daily. Chiropractor might be helpful      History of cervical cancer    Not a candidate for hrt despite her symptoms due to this history      Adult ADHD    Will try Adderall, vyvanse too expensive and Ritalin did not work.       Constipation    Fiber supplements bid, increase fluid intake and water. Stay active. Discussed diet and lifestyle concerns with patient. Offered nutrition consult for further management.          I have discontinued Ms. Mesenbrink's triamcinolone cream, albuterol, and benzonatate. I am also having her start on amphetamine-dextroamphetamine and ALPRAZolam. Additionally, I am having her maintain her fluticasone, omega-3 acid ethyl esters, MILK THISTLE PO, Glucos-Chondroit-Hyaluron-MSM (GLUCOSAMINE CHONDROITIN JOINT PO), Cholecalciferol (VITAMIN D-3 PO), Probiotic Product (PROBIOTIC PO), losartan, and fluocinonide cream.  Meds ordered this encounter  Medications  . amphetamine-dextroamphetamine (ADDERALL) 10 MG tablet    Sig: Take 1 tablet (10 mg total) by mouth 2 (two) times daily.    Dispense:  60 tablet    Refill:  0  . ALPRAZolam (XANAX) 0.25 MG tablet    Sig: Take 1 tablet (0.25 mg total) by mouth 2 (two) times daily as needed for anxiety.    Dispense:  40 tablet    Refill:  1     Penni Homans, MD

## 2016-11-14 NOTE — Progress Notes (Signed)
error:[id number] ° °

## 2016-11-26 ENCOUNTER — Ambulatory Visit (INDEPENDENT_AMBULATORY_CARE_PROVIDER_SITE_OTHER): Payer: BLUE CROSS/BLUE SHIELD | Admitting: Family Medicine

## 2016-11-26 DIAGNOSIS — E782 Mixed hyperlipidemia: Secondary | ICD-10-CM | POA: Diagnosis not present

## 2016-11-26 DIAGNOSIS — F909 Attention-deficit hyperactivity disorder, unspecified type: Secondary | ICD-10-CM | POA: Diagnosis not present

## 2016-11-26 DIAGNOSIS — N951 Menopausal and female climacteric states: Secondary | ICD-10-CM | POA: Diagnosis not present

## 2016-11-26 DIAGNOSIS — K589 Irritable bowel syndrome without diarrhea: Secondary | ICD-10-CM

## 2016-11-26 DIAGNOSIS — I10 Essential (primary) hypertension: Secondary | ICD-10-CM | POA: Diagnosis not present

## 2016-11-26 MED ORDER — AMPHETAMINE-DEXTROAMPHETAMINE 10 MG PO TABS: 10.0000 mg | ORAL_TABLET | Freq: Two times a day (BID) | ORAL | 0 refills | Status: AC

## 2016-11-26 MED ORDER — AMPHETAMINE-DEXTROAMPHETAMINE 10 MG PO TABS
10.0000 mg | ORAL_TABLET | Freq: Two times a day (BID) | ORAL | 0 refills | Status: DC
Start: 2016-11-26 — End: 2016-11-26

## 2016-11-26 MED FILL — DEXTROAMP-AMPHETAMIN 10 MG: 10 | 30 days supply | Qty: 60 | Fill #0

## 2016-11-26 NOTE — Progress Notes (Signed)
Patient ID: Melody Wilcox, female   DOB: 1965/12/24, 51 y.o.   MRN: [id number]   Subjective:    Patient ID: Melody Wilcox, female    DOB: [birth year], 51 y.o.   MRN: [id number]  Chief Complaint  Patient presents with  . Follow-up  . Hypertension  I acted as a Education administrator for Dr. Charlett Blake. Princess, RMA   Hypertension  This is a chronic problem. The problem has been gradually improving since onset. The problem is controlled. Pertinent negatives include no blurred vision, chest pain, headaches, malaise/fatigue, palpitations or shortness of breath.    Patient is in today for a follow up for hypertension, hyperlipidemia, and irritable bowel syndrome. No recent illness or acute concerns. No recent hospitalizations. She notes some recent increase in abdominal gas. No change in bowel habits. No bloody or tarry stool. Denies CP/palp/SOB/HA/congestion/fevers/GI or GU c/o. Taking meds as prescribed  Past Medical History:  Diagnosis Date  . Allergy    PCN, sulfa, Lidocaine cream and morphine  . Chicken pox middle school  . Constipation 03/19/2015  . Enuresis    childhood, resolved  . History of cervical cancer 12/15/2013  . Hyperlipidemia   . Hypertension   . IBS (irritable bowel syndrome) 2002  . Lateral epicondylitis of right elbow 09/04/2014    Past Surgical History:  Procedure Laterality Date  . BREAST REDUCTION SURGERY  2008   1998  . RADICAL HYSTERECTOMY  1995    Family History  Problem Relation Age of Onset  . Cancer Mother 63    breast cancer  . Fibromyalgia Mother   . Hypertension Father   . Heart disease Father   . Gout Brother   . Cancer Maternal Grandmother 76    breast  . Diabetes Maternal Grandfather     type 2  . Heart disease Paternal Grandmother   . Other Paternal Grandfather     brain tumor  . Hypertension Paternal Uncle   . Gout Sister     Social History   Social History  . Marital status: Married    Spouse name: N/A  . Number of children: N/A  . Years of  education: N/A   Occupational History  . [employer]    Social History Main Topics  . Smoking status: Never Smoker  . Smokeless tobacco: Never Used  . Alcohol use 4.2 oz/week    7 Glasses of wine per week     Comment: socially -- glass of wine a ngiht.  . Drug use: No  . Sexual activity: Yes    Birth control/ protection: Surgical     Comment: live with   Other Topics Concern  . Not on file   Social History Narrative  . No narrative on file    Outpatient Medications Prior to Visit  Medication Sig Dispense Refill  . ALPRAZolam (XANAX) 0.25 MG tablet Take 1 tablet (0.25 mg total) by mouth 2 (two) times daily as needed for anxiety. 40 tablet 1  . Cholecalciferol (VITAMIN D-3 PO) Take 2,000 Units by mouth daily.     . fluticasone (FLONASE) 50 MCG/ACT nasal spray Place 2 sprays into both nostrils daily as needed for allergies or rhinitis.    . Glucos-Chondroit-Hyaluron-MSM (GLUCOSAMINE CHONDROITIN JOINT PO) Take 1 tablet by mouth daily.    Marland Kitchen losartan (COZAAR) 25 MG tablet Take 1 tablet (25 mg total) by mouth daily. HTN 90 tablet 0  . MILK THISTLE PO Take 1 tablet by mouth daily. 400 MG daily    . omega-3 acid  ethyl esters (LOVAZA) 1 g capsule Take 2 g by mouth daily.     . Probiotic Product (PROBIOTIC PO) Take 1 capsule by mouth daily.     Marland Kitchen amphetamine-dextroamphetamine (ADDERALL) 10 MG tablet Take 1 tablet (10 mg total) by mouth 2 (two) times daily. 60 tablet 0  . fluocinonide cream (LIDEX) [id number] % Apply 1 application topically 2 (two) times daily.     No facility-administered medications prior to visit.     Allergies  Allergen Reactions  . Morphine And Related Hives and Rash  . Penicillins Other (See Comments)    Was told as a child Childhood allergy   . Sulfa Antibiotics Other (See Comments)    Was told as a child.  Childhood allergy  . Other Swelling    Numbing Creams  . Tape Other (See Comments)    Pulls off skin    Review of Systems  Constitutional: Negative for  fever and malaise/fatigue.  HENT: Negative for congestion.   Eyes: Negative for blurred vision.  Respiratory: Negative for cough and shortness of breath.   Cardiovascular: Negative for chest pain, palpitations and leg swelling.  Gastrointestinal: Negative for abdominal pain, blood in stool, constipation, diarrhea, heartburn, melena, nausea and vomiting.  Musculoskeletal: Negative for back pain.  Skin: Negative for rash.  Neurological: Negative for loss of consciousness and headaches.       Objective:    Physical Exam  Constitutional: She is oriented to person, place, and time. She appears well-developed and well-nourished. No distress.  HENT:  Head: Normocephalic and atraumatic.  Eyes: Conjunctivae are normal.  Neck: Normal range of motion. No thyromegaly present.  Cardiovascular: Normal rate and regular rhythm.   Pulmonary/Chest: Effort normal and breath sounds normal. She has no wheezes.  Abdominal: Soft. Bowel sounds are normal. There is no tenderness.  Musculoskeletal: Normal range of motion. She exhibits no edema or deformity.  Neurological: She is alert and oriented to person, place, and time.  Skin: Skin is warm and dry. She is not diaphoretic.  Psychiatric: She has a normal mood and affect.    BP 104/70   Pulse 63   Temp 97.8 F (36.6 C) (Oral)   Wt 127 lb 3.2 oz (57.7 kg)   SpO2 98%   BMI 22.53 kg/m  Wt Readings from Last 3 Encounters:  11/26/16 127 lb 3.2 oz (57.7 kg)  10/29/16 128 lb (58.1 kg)  07/15/16 124 lb 6.4 oz (56.4 kg)     Lab Results  Component Value Date   WBC 6.4 04/11/2016   HGB 15.0 04/11/2016   HCT 44.0 04/11/2016   PLT 340 04/11/2016   GLUCOSE 86 03/27/2016   CHOL 235 (H) 01/02/2016   TRIG 96.0 01/02/2016   HDL 81.00 01/02/2016   LDLCALC 135 (H) 01/02/2016   ALT 20 01/02/2016   AST 23 01/02/2016   NA 140 03/27/2016   K 4.9 03/27/2016   CL 101 03/27/2016   CREATININE 0.96 03/27/2016   BUN 20 03/27/2016   CO2 22 03/27/2016   TSH  1.72 01/02/2016    Lab Results  Component Value Date   TSH 1.72 01/02/2016   Lab Results  Component Value Date   WBC 6.4 04/11/2016   HGB 15.0 04/11/2016   HCT 44.0 04/11/2016   MCV 89.6 04/11/2016   PLT 340 04/11/2016   Lab Results  Component Value Date   NA 140 03/27/2016   K 4.9 03/27/2016   CO2 22 03/27/2016   GLUCOSE 86 03/27/2016  BUN 20 03/27/2016   CREATININE 0.96 03/27/2016   BILITOT 0.6 01/02/2016   ALKPHOS 53 01/02/2016   AST 23 01/02/2016   ALT 20 01/02/2016   PROT 7.1 01/02/2016   ALBUMIN 4.4 01/02/2016   CALCIUM 9.9 03/27/2016   ANIONGAP 14 12/16/2015   GFR 93.39 01/02/2016   Lab Results  Component Value Date   CHOL 235 (H) 01/02/2016   Lab Results  Component Value Date   HDL 81.00 01/02/2016   Lab Results  Component Value Date   LDLCALC 135 (H) 01/02/2016   Lab Results  Component Value Date   TRIG 96.0 01/02/2016   Lab Results  Component Value Date   CHOLHDL 3 01/02/2016   No results found for: HGBA1C     Assessment & Plan:   Problem List Items Addressed This Visit    Hyperlipidemia    Encouraged heart healthy diet, increase exercise, avoid trans fats, consider a krill oil cap daily      HTN (hypertension)    Well controlled, no changes to meds. Encouraged heart healthy diet such as the DASH diet and exercise as tolerated.       IBS (irritable bowel syndrome)    Avoid offending foods, start probiotics. Do not eat large meals in late evening and consider raising head of bed. Add fiber supplement bid and increase fluid intake to 64 oz of fluids daily      Perimenopause   Adult ADHD    Stable on low dose Adderall. May continue         I have discontinued Ms. Rudy's fluocinonide cream and amphetamine-dextroamphetamine. I have also changed her amphetamine-dextroamphetamine. Additionally, I am having her maintain her fluticasone, omega-3 acid ethyl esters, MILK THISTLE PO, Glucos-Chondroit-Hyaluron-MSM (GLUCOSAMINE  CHONDROITIN JOINT PO), Cholecalciferol (VITAMIN D-3 PO), Probiotic Product (PROBIOTIC PO), losartan, and ALPRAZolam.  Meds ordered this encounter  Medications  . DISCONTD: amphetamine-dextroamphetamine (ADDERALL) 10 MG tablet    Sig: Take 1 tablet (10 mg total) by mouth 2 (two) times daily. February 2018    Dispense:  60 tablet    Refill:  0  . amphetamine-dextroamphetamine (ADDERALL) 10 MG tablet    Sig: Take 1 tablet (10 mg total) by mouth 2 (two) times daily. March 2018    Dispense:  60 tablet    Refill:  0    CMA served as Education administrator during this visit. History, Physical and Plan performed by medical provider. Documentation and orders reviewed and attested to.  Penni Homans, MD

## 2016-11-26 NOTE — Patient Instructions (Addendum)
L tryptophan capsule for sleep Insomnia Insomnia is a sleep disorder that makes it difficult to fall asleep or to stay asleep. Insomnia can cause tiredness (fatigue), low energy, difficulty concentrating, mood swings, and poor performance at work or school. There are three different ways to classify insomnia:  Difficulty falling asleep.  Difficulty staying asleep.  Waking up too early in the morning. Any type of insomnia can be long-term (chronic) or short-term (acute). Both are common. Short-term insomnia usually lasts for three months or less. Chronic insomnia occurs at least three times a week for longer than three months. What are the causes? Insomnia may be caused by another condition, situation, or substance, such as:  Anxiety.  Certain medicines.  Gastroesophageal reflux disease (GERD) or other gastrointestinal conditions.  Asthma or other breathing conditions.  Restless legs syndrome, sleep apnea, or other sleep disorders.  Chronic pain.  Menopause. This may include hot flashes.  Stroke.  Abuse of alcohol, tobacco, or illegal drugs.  Depression.  Caffeine.  Neurological disorders, such as Alzheimer disease.  An overactive thyroid (hyperthyroidism). The cause of insomnia may not be known. What increases the risk? Risk factors for insomnia include:  Gender. Women are more commonly affected than men.  Age. Insomnia is more common as you get older.  Stress. This may involve your professional or personal life.  Income. Insomnia is more common in people with lower income.  Lack of exercise.  Irregular work schedule or night shifts.  Traveling between different time zones. What are the signs or symptoms? If you have insomnia, trouble falling asleep or trouble staying asleep is the main symptom. This may lead to other symptoms, such as:  Feeling fatigued.  Feeling nervous about going to sleep.  Not feeling rested in the morning.  Having trouble  concentrating.  Feeling irritable, anxious, or depressed. How is this treated? Treatment for insomnia depends on the cause. If your insomnia is caused by an underlying condition, treatment will focus on addressing the condition. Treatment may also include:  Medicines to help you sleep.  Counseling or therapy.  Lifestyle adjustments. Follow these instructions at home:  Take medicines only as directed by your health care provider.  Keep regular sleeping and waking hours. Avoid naps.  Keep a sleep diary to help you and your health care provider figure out what could be causing your insomnia. Include:  When you sleep.  When you wake up during the night.  How well you sleep.  How rested you feel the next day.  Any side effects of medicines you are taking.  What you eat and drink.  Make your bedroom a comfortable place where it is easy to fall asleep:  Put up shades or special blackout curtains to block light from outside.  Use a white noise machine to block noise.  Keep the temperature cool.  Exercise regularly as directed by your health care provider. Avoid exercising right before bedtime.  Use relaxation techniques to manage stress. Ask your health care provider to suggest some techniques that may work well for you. These may include:  Breathing exercises.  Routines to release muscle tension.  Visualizing peaceful scenes.  Cut back on alcohol, caffeinated beverages, and cigarettes, especially close to bedtime. These can disrupt your sleep.  Do not overeat or eat spicy foods right before bedtime. This can lead to digestive discomfort that can make it hard for you to sleep.  Limit screen use before bedtime. This includes:  Watching TV.  Using your smartphone, tablet, and  computer.  Stick to a routine. This can help you fall asleep faster. Try to do a quiet activity, brush your teeth, and go to bed at the same time each night.  Get out of bed if you are still  awake after 15 minutes of trying to sleep. Keep the lights down, but try reading or doing a quiet activity. When you feel sleepy, go back to bed.  Make sure that you drive carefully. Avoid driving if you feel very sleepy.  Keep all follow-up appointments as directed by your health care provider. This is important. Contact a health care provider if:  You are tired throughout the day or have trouble in your daily routine due to sleepiness.  You continue to have sleep problems or your sleep problems get worse. Get help right away if:  You have serious thoughts about hurting yourself or someone else. This information is not intended to replace advice given to you by your health care provider. Make sure you discuss any questions you have with your health care provider. Document Released: 09/27/2000 Document Revised: 03/01/2016 Document Reviewed: 07/01/2014 Elsevier Interactive Patient Education  2017 [employer].

## 2016-11-26 NOTE — Assessment & Plan Note (Signed)
Well controlled, no changes to meds. Encouraged heart healthy diet such as the DASH diet and exercise as tolerated.  °

## 2016-11-26 NOTE — Progress Notes (Signed)
Pre visit review using our clinic review tool, if applicable. No additional management support is needed unless otherwise documented below in the visit note. 

## 2016-12-02 NOTE — Assessment & Plan Note (Signed)
Encouraged heart healthy diet, increase exercise, avoid trans fats, consider a krill oil cap daily 

## 2016-12-02 NOTE — Assessment & Plan Note (Signed)
Avoid offending foods, start probiotics. Do not eat large meals in late evening and consider raising head of bed. Add fiber supplement bid and increase fluid intake to 64 oz of fluids daily

## 2016-12-02 NOTE — Assessment & Plan Note (Signed)
Stable on low dose Adderall. May continue

## 2017-03-24 ENCOUNTER — Telehealth: Payer: Self-pay | Admitting: Family Medicine

## 2017-03-24 DIAGNOSIS — I10 Essential (primary) hypertension: Secondary | ICD-10-CM

## 2017-03-24 MED ORDER — LOSARTAN POTASSIUM 25 MG PO TABS: 25.0000 mg | ORAL_TABLET | Freq: Every day | ORAL | 0 refills | Status: DC

## 2017-03-24 NOTE — Telephone Encounter (Signed)
Patient informed. 

## 2017-03-24 NOTE — Telephone Encounter (Signed)
Relation to QX:IHWT Call back [place], [place] [address] [phone]   Reason for call:  Patient requesting 3 month supply losartan (COZAAR) 25 MG tablet, patient states she relocated and has not found a new PCP, please advise

## 2017-03-24 NOTE — Telephone Encounter (Signed)
Sent in to pharmacy below Called the patient left her a message script sent in.

## 2017-06-09 ENCOUNTER — Other Ambulatory Visit: Payer: Self-pay | Admitting: Family Medicine

## 2017-06-09 DIAGNOSIS — I10 Essential (primary) hypertension: Secondary | ICD-10-CM
